# Patient Record
Sex: Male | Born: 1965 | Race: Black or African American | Hispanic: No | Smoking: Former smoker
Health system: Southern US, Community
[De-identification: ages and names within clinical notes are randomized; demographics above are authoritative.]

## PROBLEM LIST (undated history)

## (undated) DIAGNOSIS — J449 Chronic obstructive pulmonary disease, unspecified: Secondary | ICD-10-CM

---

## 2000-06-18 ENCOUNTER — Emergency Department (HOSPITAL_COMMUNITY): Admission: EM | Admit: 2000-06-18 | Discharge: 2000-06-18 | Payer: Self-pay | Admitting: Emergency Medicine

## 2000-06-18 ENCOUNTER — Encounter: Payer: Self-pay | Admitting: Emergency Medicine

## 2002-04-27 ENCOUNTER — Emergency Department (HOSPITAL_COMMUNITY): Admission: EM | Admit: 2002-04-27 | Discharge: 2002-04-27 | Payer: Self-pay | Admitting: Emergency Medicine

## 2002-04-27 ENCOUNTER — Encounter: Payer: Self-pay | Admitting: Emergency Medicine

## 2003-12-01 ENCOUNTER — Emergency Department (HOSPITAL_COMMUNITY): Admission: EM | Admit: 2003-12-01 | Discharge: 2003-12-01 | Payer: Self-pay | Admitting: Family Medicine

## 2004-02-07 ENCOUNTER — Emergency Department (HOSPITAL_COMMUNITY): Admission: EM | Admit: 2004-02-07 | Discharge: 2004-02-07 | Payer: Self-pay | Admitting: Emergency Medicine

## 2005-03-03 ENCOUNTER — Emergency Department (HOSPITAL_COMMUNITY): Admission: EM | Admit: 2005-03-03 | Discharge: 2005-03-03 | Payer: Self-pay | Admitting: Emergency Medicine

## 2006-07-05 ENCOUNTER — Emergency Department (HOSPITAL_COMMUNITY): Admission: EM | Admit: 2006-07-05 | Discharge: 2006-07-05 | Payer: Self-pay | Admitting: Emergency Medicine

## 2006-10-06 ENCOUNTER — Emergency Department (HOSPITAL_COMMUNITY): Admission: EM | Admit: 2006-10-06 | Discharge: 2006-10-06 | Payer: Self-pay | Admitting: Emergency Medicine

## 2007-02-16 ENCOUNTER — Emergency Department (HOSPITAL_COMMUNITY): Admission: EM | Admit: 2007-02-16 | Discharge: 2007-02-16 | Payer: Self-pay | Admitting: Emergency Medicine

## 2008-05-22 ENCOUNTER — Emergency Department (HOSPITAL_COMMUNITY): Admission: EM | Admit: 2008-05-22 | Discharge: 2008-05-22 | Payer: Self-pay | Admitting: Emergency Medicine

## 2008-05-26 ENCOUNTER — Emergency Department (HOSPITAL_COMMUNITY): Admission: EM | Admit: 2008-05-26 | Discharge: 2008-05-26 | Payer: Self-pay | Admitting: Emergency Medicine

## 2008-06-05 ENCOUNTER — Emergency Department (HOSPITAL_COMMUNITY): Admission: EM | Admit: 2008-06-05 | Discharge: 2008-06-05 | Payer: Self-pay | Admitting: Family Medicine

## 2010-05-23 LAB — POCT I-STAT, CHEM 8
BUN: 11 mg/dL (ref 6–23)
Chloride: 102 mEq/L (ref 96–112)
Glucose, Bld: 74 mg/dL (ref 70–99)
HCT: 49 % (ref 39.0–52.0)
Potassium: 3.5 mEq/L (ref 3.5–5.1)

## 2011-04-28 ENCOUNTER — Emergency Department (HOSPITAL_COMMUNITY)
Admission: EM | Admit: 2011-04-28 | Discharge: 2011-04-28 | Disposition: A | Discharge status: Home / Self Care | Payer: Self-pay | Attending: Emergency Medicine | Admitting: Emergency Medicine

## 2011-04-28 ENCOUNTER — Emergency Department (HOSPITAL_COMMUNITY): Admission: EM | Admit: 2011-04-28 | Discharge: 2011-04-28 | Discharge status: Absent without leave | Payer: Self-pay

## 2011-04-28 ENCOUNTER — Encounter (HOSPITAL_COMMUNITY): Payer: Self-pay | Admitting: Emergency Medicine

## 2011-04-28 ENCOUNTER — Emergency Department (HOSPITAL_COMMUNITY): Payer: Self-pay

## 2011-04-28 DIAGNOSIS — J189 Pneumonia, unspecified organism: Secondary | ICD-10-CM | POA: Insufficient documentation

## 2011-04-28 DIAGNOSIS — F17200 Nicotine dependence, unspecified, uncomplicated: Secondary | ICD-10-CM | POA: Insufficient documentation

## 2011-04-28 LAB — DIFFERENTIAL
Lymphocytes Relative: 9 % — ABNORMAL LOW (ref 12–46)
Monocytes Absolute: 1.7 10*3/uL — ABNORMAL HIGH (ref 0.1–1.0)
Monocytes Relative: 10 % (ref 3–12)
Neutro Abs: 12.6 10*3/uL — ABNORMAL HIGH (ref 1.7–7.7)

## 2011-04-28 LAB — BASIC METABOLIC PANEL
BUN: 7 mg/dL (ref 6–23)
CO2: 22 mEq/L (ref 19–32)
Chloride: 100 mEq/L (ref 96–112)
Creatinine, Ser: 0.82 mg/dL (ref 0.50–1.35)

## 2011-04-28 LAB — CBC
HCT: 37.5 % — ABNORMAL LOW (ref 39.0–52.0)
Hemoglobin: 13 g/dL (ref 13.0–17.0)
MCHC: 34.7 g/dL (ref 30.0–36.0)
WBC: 15.9 10*3/uL — ABNORMAL HIGH (ref 4.0–10.5)

## 2011-04-28 MED ORDER — ALBUTEROL SULFATE (5 MG/ML) 0.5% IN NEBU
5.0000 mg | INHALATION_SOLUTION | Freq: Once | RESPIRATORY_TRACT | Status: AC
  Administered 2011-04-28: 5 mg via RESPIRATORY_TRACT
  Filled 2011-04-28: qty 1

## 2011-04-28 MED ORDER — SODIUM CHLORIDE 0.9 % IV BOLUS (SEPSIS)
500.0000 mL | Freq: Once | INTRAVENOUS | Status: AC
  Administered 2011-04-28: 1000 mL via INTRAVENOUS

## 2011-04-28 MED ORDER — ALBUTEROL SULFATE HFA 108 (90 BASE) MCG/ACT IN AERS
2.0000 | INHALATION_SPRAY | RESPIRATORY_TRACT | Status: DC | PRN
  Administered 2011-04-28: 2 via RESPIRATORY_TRACT
  Filled 2011-04-28: qty 6.7

## 2011-04-28 MED ORDER — AZITHROMYCIN 250 MG PO TABS
500.0000 mg | ORAL_TABLET | Freq: Once | ORAL | Status: AC
  Administered 2011-04-28: 500 mg via ORAL
  Filled 2011-04-28: qty 2

## 2011-04-28 MED ORDER — IPRATROPIUM BROMIDE 0.02 % IN SOLN
0.5000 mg | Freq: Once | RESPIRATORY_TRACT | Status: AC
  Administered 2011-04-28: 0.5 mg via RESPIRATORY_TRACT
  Filled 2011-04-28: qty 2.5

## 2011-04-28 MED ORDER — AZITHROMYCIN 250 MG PO TABS: 250.0000 mg | ORAL_TABLET | Freq: Every day | ORAL | Status: AC

## 2011-04-28 NOTE — ED Provider Notes (Signed)
Medical screening examination/treatment/procedure(s) were conducted as a shared visit with non-physician practitioner(s) and myself.  I personally evaluated the patient during the encounter  Pt with smoking history presents with shortness of breath.  He has had pna as well in the past.  Will check cxr.  May be related to COP exacerbation.  Celene Kras, MD 04/28/11 1328

## 2011-04-28 NOTE — ED Provider Notes (Signed)
History     CSN: [id number]  Arrival date & time 04/28/11  1048   First MD Initiated Contact with Patient 04/28/11 1129      Chief Complaint  Patient presents with  . URI    (Consider location/radiation/quality/duration/timing/severity/associated sxs/prior treatment) Patient is a 46 y.o. male presenting with URI. The history is provided by the patient. No language interpreter was used.  URI The primary symptoms include fatigue, headaches, sore throat, swollen glands, cough and nausea. Primary symptoms do not include fever, ear pain, wheezing, abdominal pain or vomiting. The current episode started 3 to 5 days ago. This is a new problem. The problem has been gradually worsening.  Symptoms associated with the illness include facial pain, sinus pressure and congestion. The illness is not associated with chills, plugged ear sensation or rhinorrhea.   Flu like symptoms and cough x several days.  Taking mucinex with no relief.  Smoker. tachycardia History reviewed. No pertinent past medical history.  History reviewed. No pertinent past surgical history.  No family history on file.  History  Substance Use Topics  . Smoking status: Current Everyday Smoker -- 0.5 packs/day  . Smokeless tobacco: Not on file  . Alcohol Use: 1.8 oz/week    3 Cans of beer per week      Review of Systems  Constitutional: Positive for fatigue. Negative for fever and chills.  HENT: Positive for congestion, sore throat and sinus pressure. Negative for ear pain and rhinorrhea.   Respiratory: Positive for cough. Negative for wheezing.   Gastrointestinal: Positive for nausea. Negative for vomiting and abdominal pain.  Neurological: Positive for headaches. Negative for light-headedness and numbness.  Psychiatric/Behavioral: Negative.     Allergies  Review of patient's allergies indicates no known allergies.  Home Medications   Current Outpatient Rx  Name Route Sig Dispense Refill  . DM-GUAIFENESIN ER  30-600 MG PO TB12 Oral Take 1 tablet by mouth every 12 (twelve) hours.    Marland Kitchen DIPHENHYDRAMINE HCL 25 MG PO TABS Oral Take 50 mg by mouth at bedtime as needed. decongestant    . GABAPENTIN (PHN) PO Oral Take 1 capsule by mouth 4 (four) times daily - after meals and at bedtime.    . GUAIFENESIN ER 600 MG PO TB12 Oral Take 1,200 mg by mouth 2 (two) times daily.    Marland Kitchen NAPROXEN 250 MG PO TABS Oral Take 250 mg by mouth 2 (two) times daily with a meal.    . OVER THE COUNTER MEDICATION Oral Take 1 tablet by mouth daily as needed. Nasal decongestant    . MUCINEX FAST-MAX COLD & SINUS PO Oral Take 1 tablet by mouth 2 times daily at 12 noon and 4 pm.      BP 125/69  Pulse 106  Temp 98.7 F (37.1 C)  Resp 20  SpO2 96%  Physical Exam  Nursing note and vitals reviewed. Constitutional: He is oriented to person, place, and time. He appears well-developed and well-nourished.  HENT:  Head: Normocephalic.  Eyes: Conjunctivae and EOM are normal. Pupils are equal, round, and reactive to light.  Neck: Normal range of motion. Neck supple.  Cardiovascular: Normal rate.   Pulmonary/Chest: He has wheezes.       Mild respiratory distress with wheezing and rhonchi  Abdominal: Soft. He exhibits no distension. There is no tenderness.  Musculoskeletal: Normal range of motion.  Neurological: He is alert and oriented to person, place, and time.  Skin: Skin is warm and dry.  Psychiatric: He has a  normal mood and affect.    ED Course  Procedures (including critical care time)   Labs Reviewed  CBC  DIFFERENTIAL  BASIC METABOLIC PANEL   No results found.   No diagnosis found.    MDM  Treated for pneumonia with z-pak and inhaler.  Return if worse.  Follow up at the [facility] in [place] this week. Quit smoking. Continue mucinex.        Jethro Bastos, NP 04/29/11 1038

## 2011-04-28 NOTE — ED Notes (Signed)
Pt. Stated, I've had congestion, cold, achy body pains especially my head

## 2011-04-28 NOTE — Discharge Instructions (Signed)
Mr Delair your x-rays shows that you have the beginnings of pneumonia.  We gave you the first dose of antibiotic in the ER today.  You will take 1 pill every day x 4 days starting tomorrow.  Use the inhaler no more than 4 times a day.  Return to the ER for severe SOB or high fever, nausea and  Vomiting.  Follow up with the pcp of your choice at the [facility] or return here if worse.    Pneumonia, Adult Pneumonia is an infection of the lungs. It may be caused by a germ (virus or bacteria). Some types of pneumonia can spread easily from person to person. This can happen when you cough or sneeze. HOME CARE  Only take medicine as told by your doctor.   Take your medicine (antibiotics) as told. Finish it even if you start to feel better.   Do not smoke.   You may use a vaporizer or humidifier in your room. This can help loosen thick spit (mucus).   Sleep so you are almost sitting up (semi-upright). This helps reduce coughing.   Rest.  A shot (vaccine) can help prevent pneumonia. Shots are often advised for:  People over 44 years old.   Patients on chemotherapy.   People with long-term (chronic) lung problems.   People with immune system problems.  GET HELP RIGHT AWAY IF:   You are getting worse.   You cannot control your cough, and you are losing sleep.   You cough up blood.   Your pain gets worse, even with medicine.   You have a fever.   Any of your problems are getting worse, not better.   You have shortness of breath or chest pain.  MAKE SURE YOU:   Understand these instructions.   Will watch your condition.   Will get help right away if you are not doing well or get worse.  Document Released: 07/17/2007 Document Revised: 01/17/2011 Document Reviewed: 04/20/2010 [facility] Patient Information 2012 [place], [place].

## 2011-05-01 NOTE — ED Provider Notes (Signed)
Medical screening examination/treatment/procedure(s) were performed by non-physician practitioner and as supervising physician I was immediately available for consultation/collaboration.   Celene Kras, MD 05/01/11 [phone]

## 2014-02-07 ENCOUNTER — Emergency Department (HOSPITAL_COMMUNITY)
Admission: EM | Admit: 2014-02-07 | Discharge: 2014-02-07 | Disposition: A | Discharge status: Home / Self Care | Payer: Self-pay | Attending: Emergency Medicine | Admitting: Emergency Medicine

## 2014-02-07 ENCOUNTER — Encounter (HOSPITAL_COMMUNITY): Payer: Self-pay | Admitting: Family Medicine

## 2014-02-07 ENCOUNTER — Emergency Department (HOSPITAL_COMMUNITY): Payer: Self-pay

## 2014-02-07 DIAGNOSIS — R059 Cough, unspecified: Secondary | ICD-10-CM

## 2014-02-07 DIAGNOSIS — J441 Chronic obstructive pulmonary disease with (acute) exacerbation: Secondary | ICD-10-CM | POA: Insufficient documentation

## 2014-02-07 DIAGNOSIS — Z79899 Other long term (current) drug therapy: Secondary | ICD-10-CM | POA: Insufficient documentation

## 2014-02-07 DIAGNOSIS — R062 Wheezing: Secondary | ICD-10-CM

## 2014-02-07 DIAGNOSIS — R918 Other nonspecific abnormal finding of lung field: Secondary | ICD-10-CM | POA: Insufficient documentation

## 2014-02-07 DIAGNOSIS — R0602 Shortness of breath: Secondary | ICD-10-CM

## 2014-02-07 DIAGNOSIS — R0789 Other chest pain: Secondary | ICD-10-CM | POA: Insufficient documentation

## 2014-02-07 DIAGNOSIS — Z791 Long term (current) use of non-steroidal anti-inflammatories (NSAID): Secondary | ICD-10-CM | POA: Insufficient documentation

## 2014-02-07 DIAGNOSIS — J159 Unspecified bacterial pneumonia: Secondary | ICD-10-CM | POA: Insufficient documentation

## 2014-02-07 DIAGNOSIS — J189 Pneumonia, unspecified organism: Secondary | ICD-10-CM

## 2014-02-07 DIAGNOSIS — R000 Tachycardia, unspecified: Secondary | ICD-10-CM | POA: Insufficient documentation

## 2014-02-07 DIAGNOSIS — Z720 Tobacco use: Secondary | ICD-10-CM | POA: Insufficient documentation

## 2014-02-07 DIAGNOSIS — R05 Cough: Secondary | ICD-10-CM

## 2014-02-07 LAB — BASIC METABOLIC PANEL
ANION GAP: 11 (ref 5–15)
BUN: 8 mg/dL (ref 6–23)
CHLORIDE: 100 meq/L (ref 96–112)
CO2: 26 mmol/L (ref 19–32)
Calcium: 9.4 mg/dL (ref 8.4–10.5)
Creatinine, Ser: 1.11 mg/dL (ref 0.50–1.35)
GFR, EST NON AFRICAN AMERICAN: 78 mL/min — AB (ref >90)
Glucose, Bld: 102 mg/dL — ABNORMAL HIGH (ref 70–99)
POTASSIUM: 4.1 mmol/L (ref 3.5–5.1)
SODIUM: 137 mmol/L (ref 135–145)

## 2014-02-07 LAB — CBC WITH DIFFERENTIAL/PLATELET
BASOS PCT: 0 % (ref 0–1)
Basophils Absolute: 0 10*3/uL (ref 0.0–0.1)
EOS ABS: 0.1 10*3/uL (ref 0.0–0.7)
EOS PCT: 2 % (ref 0–5)
HCT: 48.6 % (ref 39.0–52.0)
HEMOGLOBIN: 17.1 g/dL — AB (ref 13.0–17.0)
LYMPHS PCT: 23 % (ref 12–46)
Lymphs Abs: 1.5 10*3/uL (ref 0.7–4.0)
MCH: 31.7 pg (ref 26.0–34.0)
MCHC: 35.2 g/dL (ref 30.0–36.0)
MCV: 90.2 fL (ref 78.0–100.0)
MONO ABS: 0.5 10*3/uL (ref 0.1–1.0)
Monocytes Relative: 7 % (ref 3–12)
NEUTROS PCT: 68 % (ref 43–77)
Neutro Abs: 4.4 10*3/uL (ref 1.7–7.7)
PLATELETS: 329 10*3/uL (ref 150–400)
RBC: 5.39 MIL/uL (ref 4.22–5.81)
RDW: 13.7 % (ref 11.5–15.5)
WBC: 6.5 10*3/uL (ref 4.0–10.5)

## 2014-02-07 LAB — I-STAT TROPONIN, ED: TROPONIN I, POC: 0 ng/mL (ref 0.00–0.08)

## 2014-02-07 LAB — BRAIN NATRIURETIC PEPTIDE: B Natriuretic Peptide: 45.8 pg/mL (ref 0.0–100.0)

## 2014-02-07 MED ORDER — ALBUTEROL SULFATE HFA 108 (90 BASE) MCG/ACT IN AERS
2.0000 | INHALATION_SPRAY | Freq: Once | RESPIRATORY_TRACT | Status: AC
  Administered 2014-02-07: 2 via RESPIRATORY_TRACT
  Filled 2014-02-07: qty 6.7

## 2014-02-07 MED ORDER — IPRATROPIUM BROMIDE 0.02 % IN SOLN
0.5000 mg | Freq: Once | RESPIRATORY_TRACT | Status: AC
  Administered 2014-02-07: 0.5 mg via RESPIRATORY_TRACT
  Filled 2014-02-07: qty 2.5

## 2014-02-07 MED ORDER — PREDNISONE 20 MG PO TABS: ORAL_TABLET | ORAL | Status: DC

## 2014-02-07 MED ORDER — ALBUTEROL SULFATE (2.5 MG/3ML) 0.083% IN NEBU
5.0000 mg | INHALATION_SOLUTION | Freq: Once | RESPIRATORY_TRACT | Status: AC
  Administered 2014-02-07: 5 mg via RESPIRATORY_TRACT
  Filled 2014-02-07: qty 6

## 2014-02-07 MED ORDER — PREDNISONE 20 MG PO TABS
60.0000 mg | ORAL_TABLET | Freq: Once | ORAL | Status: AC
  Administered 2014-02-07: 60 mg via ORAL
  Filled 2014-02-07: qty 3

## 2014-02-07 MED ORDER — MORPHINE SULFATE 4 MG/ML IJ SOLN
4.0000 mg | Freq: Once | INTRAMUSCULAR | Status: AC
  Administered 2014-02-07: 4 mg via INTRAVENOUS
  Filled 2014-02-07: qty 1

## 2014-02-07 MED ORDER — LEVOFLOXACIN 750 MG PO TABS: 750.0000 mg | ORAL_TABLET | Freq: Every day | ORAL | Status: DC

## 2014-02-07 MED ORDER — ALBUTEROL SULFATE HFA 108 (90 BASE) MCG/ACT IN AERS: 2.0000 | INHALATION_SPRAY | RESPIRATORY_TRACT | Status: DC | PRN

## 2014-02-07 NOTE — ED Notes (Signed)
Per pt sts productive cough x 1 week. sts hx of bronchitis. sts hx of PNA and feels the same.

## 2014-02-07 NOTE — ED Provider Notes (Signed)
MSE was initiated and I personally evaluated the patient and placed orders (if any) at  11:35 AM on February 07, 2014.  Jesse Powell is a 48 y.o. male with history of seasonal allergies who presents to the Emergency Department complaining of productive cough of green-yellow sputum that started 1 week ago. Reports associated clear rhinorrhea, mild headache behind his eyes, SOB, wheezing and constant left sided chest pain that he describes as a pressure. Cough and taking a deep breath worsen chest pain. States the headache is intermittent about every half hour. Noise and activity worsen it. He has taken alka seltzer plus with some relief. Reports fever of 105 that improved on its own with cold compresses. Denies fever since. Denies nasal congestion, sore throat, ear pain, ear discharge, watery eyes, itchy eyes, blurred vision, abdominal pain, nausea, emesis, dizziness. Pt smokes cigarettes daily but hasn't smoked in 2 days. Denies history of heart problems.   On exam he has diffuse wheezing and rhonchi, oxygenation 94% on RA, gets dyspneic when finishing sentences, and is tachycardic. Will order nebs, prednisone, labs, and CXR. Will move to pod.  The patient appears stable so that the remainder of the MSE may be completed by another provider.  Jesse Powell, [place]PA-C 02/07/14 1137  Merrie RoofJohn David Wofford III, MD 02/08/14 [phone]

## 2014-02-07 NOTE — ED Notes (Signed)
Patient states he "had a fever of 105 on Saturday, but its cause I took too much medicine".   Patient talking in full sentences.

## 2014-02-07 NOTE — Discharge Instructions (Signed)
Continue to stay well-hydrated. Continue to alternate between Tylenol and Ibuprofen for pain or fever. Use Mucinex for cough suppression/expectoration of mucus. Use netipot and flonase to help with nasal congestion. May consider over-the-counter Benadryl or other antihistamine to decrease secretions and for watery itchy eyes. Use inhaler as directed, as needed for cough/chest congestion. Take prednisone as directed to help with your lung function. Take all of the antibiotic as directed. Followup with your primary care doctor in 5-7 days for recheck of ongoing symptoms. Return to emergency department for emergent changing or worsening of symptoms.   Chronic Obstructive Pulmonary Disease Exacerbation Chronic obstructive pulmonary disease (COPD) is a common lung condition in which airflow from the lungs is limited. COPD is a general term that can be used to describe many different lung problems that limit airflow, including chronic bronchitis and emphysema. COPD exacerbations are episodes when breathing symptoms become much worse and require extra treatment. Without treatment, COPD exacerbations can be life threatening, and frequent COPD exacerbations can cause further damage to your lungs. CAUSES  1. Respiratory infections.  2. Exposure to smoke.  3. Exposure to air pollution, chemical fumes, or dust. Sometimes there is no apparent cause or trigger. RISK FACTORS 1. Smoking cigarettes. 2. Older age. 3. Frequent prior COPD exacerbations. SIGNS AND SYMPTOMS   Increased coughing.   Increased thick spit (sputum) production.   Increased wheezing.   Increased shortness of breath.   Rapid breathing.   Chest tightness. DIAGNOSIS  Your medical history, a physical exam, and tests will help your health care provider make a diagnosis. Tests may include:  A chest X-ray.  Basic lab tests.  Sputum testing.  An arterial blood gas test. TREATMENT  Depending on the severity of your COPD  exacerbation, you may need to be admitted to a hospital for treatment. Some of the treatments commonly used to treat COPD exacerbations are:   Antibiotic medicines.   Bronchodilators. These are drugs that expand the air passages. They may be given with an inhaler or nebulizer. Spacer devices may be needed to help improve drug delivery.  Corticosteroid medicines.  Supplemental oxygen therapy.  HOME CARE INSTRUCTIONS   Do not smoke. Quitting smoking is very important to prevent COPD from getting worse and exacerbations from happening as often.  Avoid exposure to all substances that irritate the airway, especially to tobacco smoke.   If you were prescribed an antibiotic medicine, finish it all even if you start to feel better.  Take all medicines as directed by your health care provider.It is important to use correct technique with inhaled medicines.  Drink enough fluids to keep your urine clear or pale yellow (unless you have a medical condition that requires fluid restriction).  Use a cool mist vaporizer. This makes it easier to clear your chest when you cough.   If you have a home nebulizer and oxygen, continue to use them as directed.   Maintain all necessary vaccinations to prevent infections.   Exercise regularly.   Eat a healthy diet.   Keep all follow-up appointments as directed by your health care provider. SEEK IMMEDIATE MEDICAL CARE IF:  You have worsening shortness of breath.   You have trouble talking.   You have severe chest pain.  You have blood in your sputum.  You have a fever.  You have weakness, vomit repeatedly, or faint.   You feel confused.   You continue to get worse. MAKE SURE YOU:   Understand these instructions.  Will watch your condition.  Will get help right away if you are not doing well or get worse. Document Released: 11/25/2006 Document Revised: 06/14/2013 Document Reviewed: 10/02/2012 [facility] Patient Information  2015 [place], [place]. This information is not intended to replace advice given to you by your health care provider. Make sure you discuss any questions you have with your health care provider.  Chest Wall Pain Chest wall pain is pain felt in or around the chest bones and muscles. It may take up to 6 weeks to get better. It may take longer if you are active. Chest wall pain can happen on its own. Other times, things like germs, injury, coughing, or exercise can cause the pain. HOME CARE  4. Avoid activities that make you tired or cause pain. Try not to use your chest, belly (abdominal), or side muscles. Do not use heavy weights. 5. Put ice on the sore area. 1. Put ice in a plastic bag. 2. Place a towel between your skin and the bag. 3. Leave the ice on for 15-20 minutes for the first 2 days. 6. Only take medicine as told by your doctor. GET HELP RIGHT AWAY IF:  4. You have more pain or are very uncomfortable. 5. You have a fever. 6. Your chest pain gets worse. 7. You have new problems. 8. You feel sick to your stomach (nauseous) or throw up (vomit). 9. You start to sweat or feel lightheaded. 10. You have a cough with mucus (phlegm). 11. You cough up blood. MAKE SURE YOU:   Understand these instructions.  Will watch your condition.  Will get help right away if you are not doing well or get worse. Document Released: 07/17/2007 Document Revised: 04/22/2011 Document Reviewed: 09/24/2010 [facility] Patient Information 2015 [place], [place]. This information is not intended to replace advice given to you by your health care provider. Make sure you discuss any questions you have with your health care provider.  Cough, Adult  A cough is a reflex. It helps you clear your throat and airways. A cough can help heal your body. A cough can last 2 or 3 weeks (acute) or may last more than 8 weeks (chronic). Some common causes of a cough can include an infection, allergy, or a cold. HOME CARE 7. Only take  medicine as told by your doctor. 8. If given, take your medicines (antibiotics) as told. Finish them even if you start to feel better. 9. Use a cold steam vaporizer or humidifier in your home. This can help loosen thick spit (secretions). 10. Sleep so you are almost sitting up (semi-upright). Use pillows to do this. This helps reduce coughing. 11. Rest as needed. 12. Stop smoking if you smoke. GET HELP RIGHT AWAY IF: 12. You have yellowish-white fluid (pus) in your thick spit. 13. Your cough gets worse. 14. Your medicine does not reduce coughing, and you are losing sleep. 15. You cough up blood. 16. You have trouble breathing. 17. Your pain gets worse and medicine does not help. 18. You have a fever. MAKE SURE YOU:   Understand these instructions.  Will watch your condition.  Will get help right away if you are not doing well or get worse. Document Released: 10/11/2010 Document Revised: 06/14/2013 Document Reviewed: 10/11/2010 [facility] Patient Information 2015 [place]ExitCare, [place]. This information is not intended to replace advice given to you by your health care provider. Make sure you discuss any questions you have with your health care provider.  How to Use an Inhaler Proper inhaler technique is very important. Good  technique ensures that the medicine reaches the lungs. Poor technique results in depositing the medicine on the tongue and back of the throat rather than in the airways. If you do not use the inhaler with good technique, the medicine will not help you. STEPS TO FOLLOW IF USING AN INHALER WITHOUT AN EXTENSION TUBE 13. Remove the cap from the inhaler. 14. If you are using the inhaler for the first time, you will need to prime it. Shake the inhaler for 5 seconds and release four puffs into the air, away from your face. Ask your health care provider or pharmacist if you have questions about priming your inhaler. 15. Shake the inhaler for 5 seconds before each breath in  (inhalation). 16. Position the inhaler so that the top of the canister faces up. 17. Put your index finger on the top of the medicine canister. Your thumb supports the bottom of the inhaler. 18. Open your mouth. 19. Either place the inhaler between your teeth and place your lips tightly around the mouthpiece, or hold the inhaler 1-2 inches away from your open mouth. If you are unsure of which technique to use, ask your health care provider. 20. Breathe out (exhale) normally and as completely as possible. 21. Press the canister down with your index finger to release the medicine. 22. At the same time as the canister is pressed, inhale deeply and slowly until your lungs are completely filled. This should take 4-6 seconds. Keep your tongue down. 23. Hold the medicine in your lungs for 5-10 seconds (10 seconds is best). This helps the medicine get into the small airways of your lungs. 24. Breathe out slowly, through pursed lips. Whistling is an example of pursed lips. 25. Wait at least 15-30 seconds between puffs. Continue with the above steps until you have taken the number of puffs your health care provider has ordered. Do not use the inhaler more than your health care provider tells you. 26. Replace the cap on the inhaler. 27. Follow the directions from your health care provider or the inhaler insert for cleaning the inhaler. STEPS TO FOLLOW IF USING AN INHALER WITH AN EXTENSION (SPACER) 19. Remove the cap from the inhaler. 20. If you are using the inhaler for the first time, you will need to prime it. Shake the inhaler for 5 seconds and release four puffs into the air, away from your face. Ask your health care provider or pharmacist if you have questions about priming your inhaler. 21. Shake the inhaler for 5 seconds before each breath in (inhalation). 22. Place the open end of the spacer onto the mouthpiece of the inhaler. 23. Position the inhaler so that the top of the canister faces up and the  spacer mouthpiece faces you. 24. Put your index finger on the top of the medicine canister. Your thumb supports the bottom of the inhaler and the spacer. 25. Breathe out (exhale) normally and as completely as possible. 26. Immediately after exhaling, place the spacer between your teeth and into your mouth. Close your lips tightly around the spacer. 27. Press the canister down with your index finger to release the medicine. 28. At the same time as the canister is pressed, inhale deeply and slowly until your lungs are completely filled. This should take 4-6 seconds. Keep your tongue down and out of the way. 29. Hold the medicine in your lungs for 5-10 seconds (10 seconds is best). This helps the medicine get into the small airways of your lungs. Exhale. 30.  Repeat inhaling deeply through the spacer mouthpiece. Again hold that breath for up to 10 seconds (10 seconds is best). Exhale slowly. If it is difficult to take this second deep breath through the spacer, breathe normally several times through the spacer. Remove the spacer from your mouth. 31. Wait at least 15-30 seconds between puffs. Continue with the above steps until you have taken the number of puffs your health care provider has ordered. Do not use the inhaler more than your health care provider tells you. 32. Remove the spacer from the inhaler, and place the cap on the inhaler. 33. Follow the directions from your health care provider or the inhaler insert for cleaning the inhaler and spacer. If you are using different kinds of inhalers, use your quick relief medicine to open the airways 10-15 minutes before using a steroid if instructed to do so by your health care provider. If you are unsure which inhalers to use and the order of using them, ask your health care provider, nurse, or respiratory therapist. If you are using a steroid inhaler, always rinse your mouth with water after your last puff, then gargle and spit out the water. Do not  swallow the water. AVOID:  Inhaling before or after starting the spray of medicine. It takes practice to coordinate your breathing with triggering the spray.  Inhaling through the nose (rather than the mouth) when triggering the spray. HOW TO DETERMINE IF YOUR INHALER IS FULL OR NEARLY EMPTY You cannot know when an inhaler is empty by shaking it. A few inhalers are now being made with dose counters. Ask your health care provider for a prescription that has a dose counter if you feel you need that extra help. If your inhaler does not have a counter, ask your health care provider to help you determine the date you need to refill your inhaler. Write the refill date on a calendar or your inhaler canister. Refill your inhaler 7-10 days before it runs out. Be sure to keep an adequate supply of medicine. This includes making sure it is not expired, and that you have a spare inhaler.  SEEK MEDICAL CARE IF:   Your symptoms are only partially relieved with your inhaler.  You are having trouble using your inhaler.  You have some increase in phlegm. SEEK IMMEDIATE MEDICAL CARE IF:   You feel little or no relief with your inhalers. You are still wheezing and are feeling shortness of breath or tightness in your chest or both.  You have dizziness, headaches, or a fast heart rate.  You have chills, fever, or night sweats.  You have a noticeable increase in phlegm production, or there is blood in the phlegm. MAKE SURE YOU:   Understand these instructions.  Will watch your condition.  Will get help right away if you are not doing well or get worse. Document Released: 01/26/2000 Document Revised: 11/18/2012 Document Reviewed: 08/27/2012 [facility]Exit[facility] Patient Information 2015 [place]Exit[place], [place]LL[place]. This information is not intended to replace advice given to you by your health care provider. Make sure you discuss any questions you have with your health care provider.

## 2014-02-07 NOTE — ED Notes (Signed)
Charge nurse notified of request to move pt for cardiac monitoring.

## 2014-02-07 NOTE — ED Provider Notes (Signed)
CSN: [id number]     Arrival date & time 02/07/14  1021 History  This chart was scribed for non-physician practitioner, Allen Derry, PA-C working with Merrie Roof, MD by Greggory Stallion, ED scribe. This patient was seen in room TR06C/TR06C and the patient's care was started at 1:55 PM.   Chief Complaint  Patient presents with  . Cough   Patient is a 48 y.o. male presenting with cough. The history is provided by the patient. No language interpreter was used.  Cough Cough characteristics:  Productive Sputum characteristics:  Green and yellow Severity:  Moderate Onset quality:  Gradual Duration:  1 week Timing:  Intermittent Progression:  Worsening Chronicity:  New Smoker: yes (quit 3 days ago)   Relieved by: akla seltzer. Worsened by:  Smoking Ineffective treatments:  None tried Associated symptoms: chest pain, headaches (intermittent, currently resolved), rhinorrhea, shortness of breath and wheezing   Associated symptoms: no chills, no diaphoresis, no ear fullness, no ear pain, no eye discharge, no fever, no myalgias, no rash, no sinus congestion and no sore throat   Risk factors: no recent travel    Bowdy Bair is a 48 y.o. male with history of seasonal allergies who presents to the Emergency Department complaining of productive cough of green-yellow sputum that started 1 week ago. Reports associated clear rhinorrhea, SOB, wheezing, and constant left sided chest pain that he describes as a pressure. Cough and taking a deep breath worsen chest pain. States the headache is intermittent about every half hour but currently resolved. Noise and activity worsen it. He has taken alka seltzer plus with some relief. Reports fever of 105 that improved on its own with cold compresses. States his cough got worse from smoking so he's stopped 2 days ago. Denies fever, nasal congestion, sore throat, ear pain, ear discharge, watery eyes, itchy eyes, blurred vision, abdominal pain,  nausea, emesis, dizziness, lightheadedness, LE swelling, numbness, paresthesias, weakness, or diaphoresis. No known sick contacts. Pt smokes cigarettes daily but hasn't smoked in 2 days and states he is in the process of quitting. Denies history of heart problems.    History reviewed. No pertinent past medical history. History reviewed. No pertinent past surgical history. History reviewed. No pertinent family history. History  Substance Use Topics  . Smoking status: Current Every Day Smoker -- 0.50 packs/day  . Smokeless tobacco: Not on file  . Alcohol Use: 1.8 oz/week    3 Cans of beer per week    Review of Systems  Constitutional: Negative for fever, chills and diaphoresis.  HENT: Positive for rhinorrhea. Negative for congestion, ear discharge, ear pain, sinus pressure, sore throat and trouble swallowing.   Eyes: Negative for discharge, itching and visual disturbance.  Respiratory: Positive for cough, shortness of breath and wheezing.   Cardiovascular: Positive for chest pain.  Gastrointestinal: Negative for nausea, vomiting, abdominal pain, diarrhea and constipation.  Musculoskeletal: Negative for myalgias.  Skin: Negative for rash.  Allergic/Immunologic: Positive for environmental allergies. Negative for immunocompromised state.  Neurological: Positive for headaches (intermittent, currently resolved). Negative for dizziness.  Psychiatric/Behavioral: Negative for confusion.  10 Systems reviewed and are negative for acute change except as noted in the HPI.   Allergies  Review of patient's allergies indicates no known allergies.  Home Medications   Prior to Admission medications   Medication Sig Start Date End Date Taking? Authorizing Provider  dextromethorphan-guaiFENesin (MUCINEX DM) 30-600 MG per 12 hr tablet Take 1 tablet by mouth every 12 (twelve) hours.    Historical Provider,  MD  diphenhydrAMINE (BENADRYL) 25 MG tablet Take 50 mg by mouth at bedtime as needed. decongestant     Historical Provider, MD  GABAPENTIN, PHN, PO Take 1 capsule by mouth 4 (four) times daily - after meals and at bedtime.    Historical Provider, MD  guaiFENesin (MUCINEX) 600 MG 12 hr tablet Take 1,200 mg by mouth 2 (two) times daily.    Historical Provider, MD  naproxen (NAPROSYN) 250 MG tablet Take 250 mg by mouth 2 (two) times daily with a meal.    Historical Provider, MD  OVER THE COUNTER MEDICATION Take 1 tablet by mouth daily as needed. Nasal decongestant    Historical Provider, MD  Phenylephrine-APAP-Guaifenesin (MUCINEX FAST-MAX COLD & SINUS PO) Take 1 tablet by mouth 2 times daily at 12 noon and 4 pm.    Historical Provider, MD   BP 112/64 mmHg  Pulse 96  Temp(Src) 98 F (36.7 C)  Resp 18  Wt 183 lb (83.008 kg)  SpO2 95%  Physical Exam  Constitutional: He is oriented to person, place, and time. Vital signs are normal. He appears well-developed and well-nourished.  Non-toxic appearance. No distress.  Tachycardic, afebrile, nontoxic but coughing and SpO2 94% on RA, becomes dyspneic at end of sentences  HENT:  Head: Normocephalic and atraumatic.  Mouth/Throat: Oropharynx is clear and moist and mucous membranes are normal. No trismus in the jaw. No uvula swelling.  Eyes: Conjunctivae and EOM are normal. Right eye exhibits no discharge. Left eye exhibits no discharge.  Neck: Normal range of motion. Neck supple.  Cardiovascular: Regular rhythm, normal heart sounds and intact distal pulses.  Tachycardia present.  Exam reveals no gallop and no friction rub.   No murmur heard. Mild tachycardic with reg rhythm, nl s1/s2, no m/r/g  Pulmonary/Chest: Effort normal. No respiratory distress. He has no decreased breath sounds. He has wheezes (diffuse). He has rhonchi in the left lower field. He has no rales. He exhibits tenderness. He exhibits no crepitus, no deformity and no retraction.  Diffuse expiratory wheezing throughout with rhonchi most notable in LLF, productive cough, becomes dyspneic  at end of sentences, SpO2 94% on RA, L chest wall TTP without crepitus or deformity  Abdominal: Soft. Normal appearance and bowel sounds are normal. He exhibits no distension. There is no tenderness. There is no rigidity, no rebound and no guarding.  Musculoskeletal: Normal range of motion.  No pedal edema or calf swelling  Neurological: He is alert and oriented to person, place, and time. He has normal strength. No sensory deficit.  Skin: Skin is warm, dry and intact. No rash noted.  Psychiatric: He has a normal mood and affect.  Nursing note and vitals reviewed.   ED Course  Procedures (including critical care time) CRITICAL CARE Performed by: Ramond Marrowamprubi-Soms, Andie Mungin Strupp   Total critical care time: 30  Critical care time was exclusive of separately billable procedures and treating other patients.  Critical care was necessary to treat or prevent imminent or life-threatening deterioration.  Critical care was time spent personally by me on the following activities: development of treatment plan with patient and/or surrogate as well as nursing, discussions with consultants, evaluation of patient's response to treatment, examination of patient, obtaining history from patient or surrogate, ordering and performing treatments and interventions, ordering and review of laboratory studies, ordering and review of radiographic studies, pulse oximetry and re-evaluation of patient's condition.   DIAGNOSTIC STUDIES: Oxygen Saturation is 95% on RA, adequate by my interpretation.    COORDINATION OF  CARE: 1:56 PM-Pt states breathing treatment provided little relief. Advised him of xray results. Discussed treatment plan which includes antibiotics and PCP follow up with pt at bedside and pt agreed to plan.   Labs Review Labs Reviewed  BASIC METABOLIC PANEL - Abnormal; Notable for the following:    Glucose, Bld 102 (*)    GFR calc non Af Amer 78 (*)    All other components within normal limits   CBC WITH DIFFERENTIAL - Abnormal; Notable for the following:    Hemoglobin 17.1 (*)    All other components within normal limits  BRAIN NATRIURETIC PEPTIDE  I-STAT TROPOININ, ED    Imaging Review Dg Chest 2 View  02/07/2014   CLINICAL DATA:  Productive cough for 1 week. Clear rhinorrhea. Headache behind the size. Shortness of breath, wheezing. Constant left-sided chest pain and is a pressure. Chest pain is worse with a cough and deep breath.  EXAM: CHEST  2 VIEW  COMPARISON:  04/28/2011, 03/03/2005  FINDINGS: Heart size is normal. There has been progression a bronchitic changes since studies in 2007 and 2013. More focal opacity at the left lung base raises question of infiltrate or possibly mass. The prominence of interstitial markings raises the question of fibrosis. Emphysematous changes are noted in the medial right lung apex. No pulmonary edema.  IMPRESSION: 1. Progression of interstitial changes in the lungs. 2. Focal opacity at the left lung base raises the question of infiltrate or mass. Follow-up chest x-ray is recommended to document clearing. CT of the chest with contrast may be helpful.   Electronically Signed   By: Rosalie Gums M.D.   On: 02/07/2014 11:55     EKG Interpretation None    EKG: sinus tachy HR 101, RAE  MDM   Final diagnoses:  COPD exacerbation  Pulmonary infiltrate on chest x-ray  CAP (community acquired pneumonia)  SOB (shortness of breath)  Wheezing  Other chest pain    48 y.o. male with cough, CP, SOB, wheezing. Lung exam with diffuse expiratory wheezing and LLF rhonchi. Dyspneic during conversation, saturations 94% on RA. CXR obtained and showed focal opacity in LLL which is ?mass vs infiltrate. Given current symptoms, likely infiltrate/PNA although pt afebrile. Could be COPD exacerbation. Given 2 neb treatments with improvement of wheezing, pt felt better after these. Given pt's CP, proceeded with CP work up, although chest pain was reproducible. Trop neg,  EKG showing mild sinus tachycardia at 101 and RAE. BMP unremarkable, CBC unremarkable. BNP neg. Unfortunately pt was initially supposed to be transferred to a pod room but due to census overload he was not transferred therefore care was done in fast track, which cause slight delay but nothing significant. After 2 nebs, pt ambulated without desats, and felt much better. Given prednisone. Discharged home with inhaler. Discussed symptom control, and given levaquin for COPD exacerbation vs PNA. Discussed f/up with PCP in 1wk but strict return precautions given. I explained the diagnosis and have given explicit precautions to return to the ER including for any other new or worsening symptoms. The patient understands and accepts the medical plan as it's been dictated and I have answered their questions. Discharge instructions concerning home care and prescriptions have been given. The patient is STABLE and is discharged to home in good condition.  BP 118/81 mmHg  Pulse 98  Temp(Src) 98.2 F (36.8 C) (Oral)  Resp 18  Wt 183 lb (83.008 kg)  SpO2 94%  Meds ordered this encounter  Medications  . albuterol (PROVENTIL) (  2.5 MG/3ML) 0.083% nebulizer solution 5 mg    Sig:   . ipratropium (ATROVENT) nebulizer solution 0.5 mg    Sig:   . predniSONE (DELTASONE) tablet 60 mg    Sig:   . ipratropium (ATROVENT) nebulizer solution 0.5 mg    Sig:   . albuterol (PROVENTIL) (2.5 MG/3ML) 0.083% nebulizer solution 5 mg    Sig:   . morphine 4 MG/ML injection 4 mg    Sig:   . albuterol (PROVENTIL HFA;VENTOLIN HFA) 108 (90 BASE) MCG/ACT inhaler 2 puff    Sig:   . albuterol (PROVENTIL HFA;VENTOLIN HFA) 108 (90 BASE) MCG/ACT inhaler    Sig: Inhale 2 puffs into the lungs every 2 (two) hours as needed for wheezing or shortness of breath (cough).    Dispense:  1 Inhaler    Refill:  0    Order Specific Question:  Supervising Provider    Answer:  Eber HongMILLER, BRIAN D [[id number]]  . predniSONE (DELTASONE) 20 MG tablet    Sig: 2  tabs po daily x 4 days    Dispense:  8 tablet    Refill:  0    Order Specific Question:  Supervising Provider    Answer:  Eber HongMILLER, BRIAN D [[id number]]  . levofloxacin (LEVAQUIN) 750 MG tablet    Sig: Take 1 tablet (750 mg total) by mouth daily. X 7 days    Dispense:  7 tablet    Refill:  0    Order Specific Question:  Supervising Provider    Answer:  Eber HongMILLER, BRIAN D [[id number]]     I personally performed the services described in this documentation, which was scribed in my presence. The recorded information has been reviewed and is accurate.  Donnita FallsMercedes Strupp Plains[place] Centeramprubi-Soms, PA-C 02/07/14 [address]1658  Peja Allender Strupp Naturitaamprubi-Soms, [place]PA-C 02/07/14 1724  Merrie RoofJohn David Wofford III, MD 02/08/14 [phone]

## 2014-02-07 NOTE — ED Notes (Signed)
Pt ambulated in hallway and pulse ox did not drop below 90%.  PA made aware.

## 2017-02-11 HISTORY — PX: LUNG SURGERY: SHX703

## 2018-05-12 ENCOUNTER — Other Ambulatory Visit: Payer: Self-pay

## 2018-05-12 ENCOUNTER — Encounter (HOSPITAL_COMMUNITY): Payer: Self-pay

## 2018-05-12 ENCOUNTER — Emergency Department (HOSPITAL_COMMUNITY)
Admission: EM | Admit: 2018-05-12 | Discharge: 2018-05-12 | Disposition: A | Discharge status: Home / Self Care | Payer: No Typology Code available for payment source | Attending: Emergency Medicine | Admitting: Emergency Medicine

## 2018-05-12 ENCOUNTER — Emergency Department (HOSPITAL_COMMUNITY): Payer: No Typology Code available for payment source

## 2018-05-12 DIAGNOSIS — Z79899 Other long term (current) drug therapy: Secondary | ICD-10-CM | POA: Insufficient documentation

## 2018-05-12 DIAGNOSIS — F17210 Nicotine dependence, cigarettes, uncomplicated: Secondary | ICD-10-CM | POA: Insufficient documentation

## 2018-05-12 DIAGNOSIS — J441 Chronic obstructive pulmonary disease with (acute) exacerbation: Secondary | ICD-10-CM | POA: Diagnosis not present

## 2018-05-12 DIAGNOSIS — R0602 Shortness of breath: Secondary | ICD-10-CM | POA: Diagnosis present

## 2018-05-12 HISTORY — DX: Chronic obstructive pulmonary disease, unspecified: J44.9

## 2018-05-12 LAB — CBC WITH DIFFERENTIAL/PLATELET
Abs Immature Granulocytes: 0.03 10*3/uL (ref 0.00–0.07)
Basophils Absolute: 0 10*3/uL (ref 0.0–0.1)
Basophils Relative: 0 %
EOS PCT: 5 %
Eosinophils Absolute: 0.3 10*3/uL (ref 0.0–0.5)
HCT: 45.6 % (ref 39.0–52.0)
Hemoglobin: 15.5 g/dL (ref 13.0–17.0)
Immature Granulocytes: 1 %
LYMPHS PCT: 23 %
Lymphs Abs: 1.4 10*3/uL (ref 0.7–4.0)
MCH: 30.5 pg (ref 26.0–34.0)
MCHC: 34 g/dL (ref 30.0–36.0)
MCV: 89.6 fL (ref 80.0–100.0)
MONO ABS: 0.6 10*3/uL (ref 0.1–1.0)
MONOS PCT: 11 %
Neutro Abs: 3.7 10*3/uL (ref 1.7–7.7)
Neutrophils Relative %: 60 %
Platelets: 547 10*3/uL — ABNORMAL HIGH (ref 150–400)
RBC: 5.09 MIL/uL (ref 4.22–5.81)
RDW: 12.2 % (ref 11.5–15.5)
WBC: 6 10*3/uL (ref 4.0–10.5)
nRBC: 0 % (ref 0.0–0.2)

## 2018-05-12 LAB — BASIC METABOLIC PANEL
Anion gap: 13 (ref 5–15)
BUN: 10 mg/dL (ref 6–20)
CHLORIDE: 97 mmol/L — AB (ref 98–111)
CO2: 20 mmol/L — AB (ref 22–32)
CREATININE: 1.1 mg/dL (ref 0.61–1.24)
Calcium: 9.1 mg/dL (ref 8.9–10.3)
GFR calc Af Amer: >60 mL/min (ref >60)
GFR calc non Af Amer: >60 mL/min (ref >60)
Glucose, Bld: 146 mg/dL — ABNORMAL HIGH (ref 70–99)
Potassium: 4.2 mmol/L (ref 3.5–5.1)
Sodium: 130 mmol/L — ABNORMAL LOW (ref 135–145)

## 2018-05-12 LAB — TROPONIN I: Troponin I: <0.03 ng/mL (ref <0.03)

## 2018-05-12 MED ORDER — ALBUTEROL SULFATE HFA 108 (90 BASE) MCG/ACT IN AERS
8.0000 | INHALATION_SPRAY | Freq: Once | RESPIRATORY_TRACT | Status: AC
  Administered 2018-05-12: 8 via RESPIRATORY_TRACT
  Filled 2018-05-12: qty 6.7

## 2018-05-12 MED ORDER — MAGNESIUM SULFATE 2 GM/50ML IV SOLN
2.0000 g | Freq: Once | INTRAVENOUS | Status: AC
  Administered 2018-05-12: 2 g via INTRAVENOUS
  Filled 2018-05-12: qty 50

## 2018-05-12 MED ORDER — IPRATROPIUM-ALBUTEROL 20-100 MCG/ACT IN AERS
2.0000 | INHALATION_SPRAY | Freq: Once | RESPIRATORY_TRACT | Status: AC
  Administered 2018-05-12: 2 via RESPIRATORY_TRACT
  Filled 2018-05-12: qty 4

## 2018-05-12 MED ORDER — AEROCHAMBER PLUS FLO-VU MISC
1.0000 | Freq: Once | Status: AC
  Administered 2018-05-12: 1
  Filled 2018-05-12: qty 1

## 2018-05-12 MED ORDER — PREDNISONE 20 MG PO TABS
60.0000 mg | ORAL_TABLET | Freq: Once | ORAL | Status: AC
  Administered 2018-05-12: 60 mg via ORAL
  Filled 2018-05-12: qty 3

## 2018-05-12 MED ORDER — PREDNISONE 20 MG PO TABS: ORAL_TABLET | ORAL | 0 refills | Status: DC

## 2018-05-12 NOTE — ED Triage Notes (Signed)
Pt arrives for SOB x3 weeks, pt has hx of COPD. Pt not on home O2. Pt reports some relief with at home albuterol treatment. Pt tachypneicic at rest. Pt endorses cough when laying flat, green in color. Pt denies fevers.

## 2018-05-12 NOTE — ED Notes (Signed)
ED Provider at bedside. 

## 2018-05-12 NOTE — ED Provider Notes (Signed)
[facility] EMERGENCY DEPARTMENT Provider Note   CSN: [id number] Arrival date & time: 05/12/18  1525    History   Chief Complaint Chief Complaint  Patient presents with  . Shortness of Breath    HPI Jesse Powell is a 53 y.o. male.     53 yo M with a significant past medical history of COPD comes in with a chief complaint of shortness of breath.  This been going on for the past 3 weeks.  Having cough and increased sputum.  Denies change in sputum denies fevers.  Having some right-sided chest pain that is worse with coughing.  Denies lower extremity edema denies orthopnea or PND.  Feels like his prior history of COPD.  Was trying to hold off until his doctor's appointment  tomorrow but was unable to make it.  The history is provided by the patient.  Shortness of Breath  Severity:  Moderate Onset quality:  Gradual Duration:  3 weeks Timing:  Constant Progression:  Worsening Chronicity:  New Relieved by:  Nothing Worsened by:  Nothing Ineffective treatments:  None tried Associated symptoms: cough   Associated symptoms: no abdominal pain, no chest pain, no fever, no headaches, no rash and no vomiting     Past Medical History:  Diagnosis Date  . COPD (chronic obstructive pulmonary disease) (HCC)     There are no active problems to display for this patient.   Past Surgical History:  Procedure Laterality Date  . LUNG SURGERY  2019        Home Medications    Prior to Admission medications   Medication Sig Start Date End Date Taking? Authorizing Provider  albuterol (PROVENTIL HFA;VENTOLIN HFA) 108 (90 BASE) MCG/ACT inhaler Inhale 2 puffs into the lungs every 2 (two) hours as needed for wheezing or shortness of breath (cough). 02/07/14  Yes Street, [place], PA-C  cetirizine (ZYRTEC) 10 MG tablet Take 10 mg by mouth daily.   Yes [provider]  fluticasone (FLONASE) 50 MCG/ACT nasal spray Place 1 spray into both nostrils daily.   Yes  [provider]  gabapentin (NEURONTIN) 300 MG capsule Take 300-600 mg by mouth at bedtime.   Yes [provider]  ibuprofen (ADVIL,MOTRIN) 800 MG tablet Take 800 mg by mouth every 8 (eight) hours as needed for moderate pain.   Yes [provider]  naproxen (NAPROSYN) 500 MG tablet Take 500 mg by mouth 2 (two) times daily.   Yes [provider]  levofloxacin (LEVAQUIN) 750 MG tablet Take 1 tablet (750 mg total) by mouth daily. X 7 days Patient not taking: Reported on 05/12/2018 02/07/14   Street, [place], PA-C  predniSONE (DELTASONE) 20 MG tablet 2 tabs po daily x 4 days 05/12/18   Melene Plan, DO    Family History No family history on file.  Social History Social History   Tobacco Use  . Smoking status: Current Every Day Smoker    Packs/day: 0.50  Substance Use Topics  . Alcohol use: Yes    Alcohol/week: 3.0 standard drinks    Types: 3 Cans of beer per week  . Drug use: No     Allergies   Patient has no known allergies.   Review of Systems Review of Systems  Constitutional: Negative for chills and fever.  HENT: Negative for congestion and facial swelling.   Eyes: Negative for discharge and visual disturbance.  Respiratory: Positive for cough and shortness of breath.   Cardiovascular: Negative for chest pain and palpitations.  Gastrointestinal: Negative for abdominal pain, diarrhea and vomiting.  Musculoskeletal: Negative for arthralgias and myalgias.  Skin: Negative for color change and rash.  Neurological: Negative for tremors, syncope and headaches.  Psychiatric/Behavioral: Negative for confusion and dysphoric mood.     Physical Exam Updated Vital Signs BP 127/80   Pulse (!) 108   Temp 98.2 F (36.8 C) (Oral)   Resp 19   SpO2 92%   Physical Exam Vitals signs and nursing note reviewed.  Constitutional:      Appearance: He is well-developed.  HENT:     Head: Normocephalic and atraumatic.  Eyes:     Pupils: Pupils are  equal, round, and reactive to light.  Neck:     Musculoskeletal: Normal range of motion and neck supple.     Vascular: No JVD.  Cardiovascular:     Rate and Rhythm: Regular rhythm. Tachycardia present.     Heart sounds: No murmur. No friction rub. No gallop.   Pulmonary:     Effort: Tachypnea present. No respiratory distress.     Breath sounds: Wheezing (diffuse, with prolonged expiration) present.  Chest:     Comments: Tenderness to the right lateral rib margin reproduces his chest pain. Abdominal:     General: There is no distension.     Tenderness: There is no guarding or rebound.  Musculoskeletal: Normal range of motion.  Skin:    Coloration: Skin is not pale.     Findings: No rash.  Neurological:     Mental Status: He is alert and oriented to person, place, and time.  Psychiatric:        Behavior: Behavior normal.      ED Treatments / Results  Labs (all labs ordered are listed, but only abnormal results are displayed) Labs Reviewed  CBC WITH DIFFERENTIAL/PLATELET - Abnormal; Notable for the following components:      Result Value   Platelets 547 (*)    All other components within normal limits  BASIC METABOLIC PANEL - Abnormal; Notable for the following components:   Sodium 130 (*)    Chloride 97 (*)    CO2 20 (*)    Glucose, Bld 146 (*)    All other components within normal limits  TROPONIN I    EKG EKG Interpretation  Date/Time:  Tuesday May 12 2018 15:36:41 EDT Ventricular Rate:  109 PR Interval:    QRS Duration: 86 QT Interval:  328 QTC Calculation: 442 R Axis:   87 Text Interpretation:  Sinus tachycardia Right atrial enlargement septal infarct, age indeterminant Confirmed by Raeford RazorKohut, Stephen [phone]([id number]) on 05/12/2018 3:52:50 PM   Radiology Dg Chest 2 View  Result Date: 05/12/2018 CLINICAL DATA:  Shortness of breath for several weeks EXAM: CHEST - 2 VIEW COMPARISON:  02/07/2014 FINDINGS: Cardiac shadow is stable. The lungs are hyperinflated. Chronic  scarring is noted bilaterally. No focal infiltrate or sizable effusion is seen. No acute bony abnormality is noted. IMPRESSION: COPD with chronic scarring. Electronically Signed   By: Alcide CleverMark  Lukens M.D.   On: 05/12/2018 16:25    Procedures Procedures (including critical care time)  Medications Ordered in ED Medications  albuterol (PROVENTIL HFA;VENTOLIN HFA) 108 (90 Base) MCG/ACT inhaler 8 puff (8 puffs Inhalation Given 05/12/18 1605)  aerochamber plus with mask device 1 each (1 each Other Given 05/12/18 1608)  Ipratropium-Albuterol (COMBIVENT) respimat 2 puff (2 puffs Inhalation Given 05/12/18 1656)  predniSONE (DELTASONE) tablet 60 mg (60 mg Oral Given 05/12/18 1605)  magnesium sulfate IVPB 2 g 50 mL (  0 g Intravenous Stopped 05/12/18 1646)     Initial Impression / Assessment and Plan / ED Course  I have reviewed the triage vital signs and the nursing notes.  Pertinent labs & imaging results that were available during my care of the patient were reviewed by me and considered in my medical decision making (see chart for details).        53 yo M with a chief complaint of as of breath.  Feels like his prior COPD.  Clinically the patient has a COPD exacerbation.  With the recent coronavirus pandemic will give MDIs incentive nebs.  Give IV mag and steroids.  Reassess.  The patient was reassessed then breathing much easier.  Talking complete sentences.  No continued tachypnea.  At this point will have him use his albuterol inhaler every 4 hours while awake.  We will have him take the Combivent inhaler home and use that as well.  He has a PCP appointment tomorrow.  Burst of steroids.  6:17 PM:  I have discussed the diagnosis/risks/treatment options with the patient and believe the pt to be eligible for discharge home to follow-up with PCP. We also discussed returning to the ED immediately if new or worsening sx occur. We discussed the sx which are most concerning (e.g., sudden worsening sob, need to  use your inhaler more often than every four hours, fever) that necessitate immediate return. Medications administered to the patient during their visit and any new prescriptions provided to the patient are listed below.  Medications given during this visit Medications  albuterol (PROVENTIL HFA;VENTOLIN HFA) 108 (90 Base) MCG/ACT inhaler 8 puff (8 puffs Inhalation Given 05/12/18 1605)  aerochamber plus with mask device 1 each (1 each Other Given 05/12/18 1608)  Ipratropium-Albuterol (COMBIVENT) respimat 2 puff (2 puffs Inhalation Given 05/12/18 1656)  predniSONE (DELTASONE) tablet 60 mg (60 mg Oral Given 05/12/18 1605)  magnesium sulfate IVPB 2 g 50 mL (0 g Intravenous Stopped 05/12/18 1646)     The patient appears reasonably screen and/or stabilized for discharge and I doubt any other medical condition or other [facility] requiring further screening, evaluation, or treatment in the ED at this time prior to discharge.    Final Clinical Impressions(s) / ED Diagnoses   Final diagnoses:  COPD exacerbation [facility])    ED Discharge Orders         Ordered    predniSONE (DELTASONE) 20 MG tablet     05/12/18 1813           Melene Plan, DO 05/12/18 1817

## 2018-05-12 NOTE — ED Notes (Signed)
Patient verbalizes understanding of discharge instructions. Opportunity for questioning and answers were provided. Armband removed by staff, pt discharged from ED.  

## 2018-05-12 NOTE — ED Notes (Signed)
Patient transported to X-ray 

## 2018-05-12 NOTE — Discharge Instructions (Signed)
Please follow-up with your doctor tomorrow.  Use your inhaler every 4 hours(6 puffs) while awake, return for sudden worsening shortness of breath, or if you need to use your inhaler more often.   You can also take home the Combivent inhaler and use it 2 puffs in the morning and 2 puffs at night.  Take the steroids as prescribed.

## 2018-07-23 ENCOUNTER — Emergency Department (HOSPITAL_COMMUNITY)
Admission: EM | Admit: 2018-07-23 | Discharge: 2018-07-23 | Disposition: A | Discharge status: Home / Self Care | Payer: Self-pay | Attending: Emergency Medicine | Admitting: Emergency Medicine

## 2018-07-23 ENCOUNTER — Emergency Department (HOSPITAL_COMMUNITY): Payer: Self-pay

## 2018-07-23 ENCOUNTER — Other Ambulatory Visit: Payer: Self-pay

## 2018-07-23 DIAGNOSIS — R911 Solitary pulmonary nodule: Secondary | ICD-10-CM | POA: Insufficient documentation

## 2018-07-23 DIAGNOSIS — Z79899 Other long term (current) drug therapy: Secondary | ICD-10-CM | POA: Insufficient documentation

## 2018-07-23 DIAGNOSIS — Z20828 Contact with and (suspected) exposure to other viral communicable diseases: Secondary | ICD-10-CM | POA: Insufficient documentation

## 2018-07-23 DIAGNOSIS — Z85118 Personal history of other malignant neoplasm of bronchus and lung: Secondary | ICD-10-CM | POA: Insufficient documentation

## 2018-07-23 DIAGNOSIS — F17210 Nicotine dependence, cigarettes, uncomplicated: Secondary | ICD-10-CM | POA: Insufficient documentation

## 2018-07-23 DIAGNOSIS — J449 Chronic obstructive pulmonary disease, unspecified: Secondary | ICD-10-CM | POA: Insufficient documentation

## 2018-07-23 DIAGNOSIS — J189 Pneumonia, unspecified organism: Secondary | ICD-10-CM | POA: Insufficient documentation

## 2018-07-23 DIAGNOSIS — Z902 Acquired absence of lung [part of]: Secondary | ICD-10-CM | POA: Insufficient documentation

## 2018-07-23 LAB — CBC WITH DIFFERENTIAL/PLATELET
Abs Immature Granulocytes: 0.01 10*3/uL (ref 0.00–0.07)
Basophils Absolute: 0 10*3/uL (ref 0.0–0.1)
Basophils Relative: 1 %
Eosinophils Absolute: 0.4 10*3/uL (ref 0.0–0.5)
Eosinophils Relative: 8 %
HCT: 39.1 % (ref 39.0–52.0)
Hemoglobin: 12.9 g/dL — ABNORMAL LOW (ref 13.0–17.0)
Immature Granulocytes: 0 %
Lymphocytes Relative: 33 %
Lymphs Abs: 1.7 10*3/uL (ref 0.7–4.0)
MCH: 30.1 pg (ref 26.0–34.0)
MCHC: 33 g/dL (ref 30.0–36.0)
MCV: 91.1 fL (ref 80.0–100.0)
Monocytes Absolute: 0.6 10*3/uL (ref 0.1–1.0)
Monocytes Relative: 11 %
Neutro Abs: 2.6 10*3/uL (ref 1.7–7.7)
Neutrophils Relative %: 47 %
Platelets: 748 10*3/uL — ABNORMAL HIGH (ref 150–400)
RBC: 4.29 MIL/uL (ref 4.22–5.81)
RDW: 14 % (ref 11.5–15.5)
WBC: 5.4 10*3/uL (ref 4.0–10.5)
nRBC: 0 % (ref 0.0–0.2)

## 2018-07-23 LAB — BASIC METABOLIC PANEL
Anion gap: 8 (ref 5–15)
BUN: 10 mg/dL (ref 6–20)
CO2: 25 mmol/L (ref 22–32)
Calcium: 9.4 mg/dL (ref 8.9–10.3)
Chloride: 104 mmol/L (ref 98–111)
Creatinine, Ser: 1.18 mg/dL (ref 0.61–1.24)
GFR calc Af Amer: >60 mL/min (ref >60)
GFR calc non Af Amer: >60 mL/min (ref >60)
Glucose, Bld: 96 mg/dL (ref 70–99)
Potassium: 4.8 mmol/L (ref 3.5–5.1)
Sodium: 137 mmol/L (ref 135–145)

## 2018-07-23 LAB — D-DIMER, QUANTITATIVE: D-Dimer, Quant: 0.55 ug/mL-FEU — ABNORMAL HIGH (ref 0.00–0.50)

## 2018-07-23 LAB — I-STAT TROPONIN, ED: Troponin i, poc: 0 ng/mL (ref 0.00–0.08)

## 2018-07-23 MED ORDER — HYDROCODONE-ACETAMINOPHEN 5-325 MG PO TABS
1.0000 | ORAL_TABLET | Freq: Once | ORAL | Status: AC
  Administered 2018-07-23: 1 via ORAL
  Filled 2018-07-23: qty 1

## 2018-07-23 MED ORDER — IOHEXOL 350 MG/ML SOLN
75.0000 mL | Freq: Once | INTRAVENOUS | Status: AC | PRN
  Administered 2018-07-23: 75 mL via INTRAVENOUS

## 2018-07-23 MED ORDER — AZITHROMYCIN 250 MG PO TABS: 250.0000 mg | ORAL_TABLET | Freq: Every day | ORAL | 0 refills | Status: DC

## 2018-07-23 MED ORDER — AMOXICILLIN-POT CLAVULANATE 875-125 MG PO TABS: 1.0000 | ORAL_TABLET | Freq: Two times a day (BID) | ORAL | 0 refills | Status: DC

## 2018-07-23 NOTE — ED Triage Notes (Signed)
POV d/t R. Sided nonradiating chest pain for x2 weeks

## 2018-07-23 NOTE — ED Provider Notes (Signed)
[facility] Provider Note   CSN: [id number] Arrival date & time: 07/23/18  1443    History   Chief Complaint Chief Complaint  Patient presents with   Chest Pain    HPI Jesse Powell is a 53 y.o. 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 Yesterday he began having pain with breathing, mostly when taking a deep breath. Pain is right chest, below his breast. He has been taking ibuprofen for symptoms without much relief. He recently had a COPD exacerbation and finished amoxicillin on Sunday. His cough and mucus production have largely improved and he feels as though that resolved. He reports a recent stress test last year, just prior to the lobectomy in January 2019, which was normal. He has no known cardiac disease. No hx DVT/PE, no unilateral or bilateral leg swelling or pain, no recent surgery or immobilization. Hx of smoking, reports he stopped 2 years ago. Not on anticoagulation.     The history is provided by the patient.    Past Medical History:  Diagnosis Date   COPD (chronic obstructive pulmonary disease) (HCC)     There are no active problems to display for this patient.   Past Surgical History:  Procedure Laterality Date   LUNG SURGERY  2019        Home Medications    Prior to Admission medications   Medication Sig Start Date End Date Taking? Authorizing Provider  albuterol (PROVENTIL HFA;VENTOLIN HFA) 108 (90 BASE) MCG/ACT inhaler Inhale 2 puffs into the lungs every 2 (two) hours as needed for wheezing or shortness of breath (cough). 02/07/14  Yes Street, [place], PA-C  cetirizine (ZYRTEC) 10 MG tablet Take 10 mg by mouth daily.   Yes [provider]  fluticasone (FLONASE) 50 MCG/ACT nasal spray Place 1 spray into both nostrils  daily.   Yes [provider]  gabapentin (NEURONTIN) 300 MG capsule Take 300-600 mg by mouth 2 (two) times daily.    Yes [provider]  ibuprofen (ADVIL,MOTRIN) 800 MG tablet Take 800 mg by mouth every 8 (eight) hours as needed for moderate pain.   Yes [provider]  naproxen (NAPROSYN) 500 MG tablet Take 500 mg by mouth 2 (two) times daily.   Yes [provider]  amoxicillin-clavulanate (AUGMENTIN) 875-125 MG tablet Take 1 tablet by mouth every 12 (twelve) hours. 07/23/18   Jamiah Recore, [place] N, PA-C  azithromycin (ZITHROMAX) 250 MG tablet Take 1 tablet (250 mg total) by mouth daily. Take first 2 tablets together, then 1 every day until finished. 07/23/18   [place] Bryngelson, [place] N, PA-C  levofloxacin (LEVAQUIN) 750 MG tablet Take 1 tablet (750 mg total) by mouth daily. X 7 days Patient not taking: Reported on 05/12/2018 02/07/14   Street, [place]ode[place]cedes, PA-C  predniSONE (DELTASONE) 20 MG tablet 2 tabs po daily x 4 days Patient not taking: Reported on 07/23/2018 05/12/18   Melene PlanFloyd, Dan, DO    Family History No family history on file.  Social History Social History   Tobacco Use   Smoking status: Current Every Day Smoker    Packs/day: 0.50  Substance Use Topics   Alcohol use: Yes    Alcohol/week: 3.0 standard drinks    Types: 3 Cans of beer per week   Drug use: No  Allergies   Patient has no known allergies.   Review of Systems Review of Systems  Constitutional: Negative for chills and fever.  Respiratory: Positive for shortness of breath. Negative for cough.   Cardiovascular: Positive for chest pain. Negative for palpitations and leg swelling.  All other systems reviewed and are negative.    Physical Exam Updated Vital Signs BP (!) 133/96    Pulse 71    Temp 97.9 F (36.6 C) (Oral)    Resp (!) 24    Ht 5\' 9"  (1.753 m)    Wt 76.7 kg    SpO2 97%    BMI 24.96 kg/m   Physical Exam Vitals signs and nursing note reviewed.  Constitutional:       Appearance: He is well-developed.  HENT:     Head: Normocephalic and atraumatic.     Mouth/Throat:     Mouth: Mucous membranes are moist.  Eyes:     Conjunctiva/sclera: Conjunctivae normal.  Neck:     Musculoskeletal: Normal range of motion and neck supple. No muscular tenderness.  Cardiovascular:     Rate and Rhythm: Normal rate and regular rhythm.     Pulses: Normal pulses.     Heart sounds: Normal heart sounds.  Pulmonary:     Effort: Pulmonary effort is normal. No respiratory distress.     Breath sounds: Normal breath sounds.     Comments: Speaking in full sentences Chest:     Chest wall: Tenderness (Right lower anterior chest wall, no crepitus.  No skin changes.) present.  Abdominal:     General: Bowel sounds are normal.     Palpations: Abdomen is soft.     Tenderness: There is no abdominal tenderness. There is no guarding or rebound.  Musculoskeletal:     Right lower leg: No edema.     Left lower leg: No edema.  Skin:    General: Skin is warm.  Neurological:     Mental Status: He is alert.  Psychiatric:        Behavior: Behavior normal.      ED Treatments / Results  Labs (all labs ordered are listed, but only abnormal results are displayed) Labs Reviewed  CBC WITH DIFFERENTIAL/PLATELET - Abnormal; Notable for the following components:      Result Value   Hemoglobin 12.9 (*)    Platelets 748 (*)    All other components within normal limits  D-DIMER, QUANTITATIVE (NOT [facility]) - Abnormal; Notable for the following components:   D-Dimer, Quant 0.55 (*)    All other components within normal limits  NOVEL CORONAVIRUS, NAA (HOSPITAL ORDER, SEND-OUT TO REF LAB)  BASIC METABOLIC PANEL  I-STAT TROPONIN, ED    EKG None  Radiology Dg Chest 2 View  Result Date: 07/23/2018 CLINICAL DATA:  Right-sided chest pain and shortness of breath EXAM: CHEST - 2 VIEW COMPARISON:  05/12/2018 FINDINGS: Cardiac shadows within normal limits. The lungs are hyperinflated consistent  with COPD. Mild scarring is again noted in the bases bilaterally. No focal infiltrate or effusion is seen. IMPRESSION: COPD and chronic scarring.  No acute abnormality noted. Electronically Signed   By: Alcide CleverMark  Lukens M.D.   On: 07/23/2018 15:52   Ct Angio Chest Pe W/cm &/or Wo Cm  Result Date: 07/23/2018 CLINICAL DATA:  Chest pain with positive D-dimer EXAM: CT ANGIOGRAPHY CHEST WITH CONTRAST TECHNIQUE: Multidetector CT imaging of the chest was performed using the standard protocol during bolus administration of intravenous contrast. Multiplanar CT image reconstructions and MIPs were  obtained to evaluate the vascular anatomy. CONTRAST:  75mL OMNIPAQUE IOHEXOL 350 MG/ML SOLN COMPARISON:  Chest radiograph from 07/23/2018 FINDINGS: Cardiovascular: No filling defect is identified in the pulmonary arterial tree to suggest pulmonary embolus. Contrast was timed for pulmonary arterial opacification and there is little to no contrast in the systemic arterial supply at the time of imaging. Mild atherosclerotic calcification of the aortic arch. Mild ectasia of the ascending thoracic aorta at 3.6 cm. Mediastinum/Nodes: 7 mm AP window lymph node, image 53/12. No overt pathologic adenopathy identified. Lungs/Pleura: Emphysema is primarily paraseptal bulla. Biapical pleuroparenchymal scarring. Considerable bilateral cystic bronchiectasis. No pneumothorax. And irregular and somewhat ill-defined nodular density in the right upper lobe measures 1.1 by 0.9 by 0.6. Bandlike density along the upper margin of the minor fissure noted along with volume loss and airspace opacities in both lower lobes. Indistinct scattered airspace opacities in the left lower lobe along with peribronchovascular volume loss in the left upper lobe and associated scarring. No pleural effusion. Upper Abdomen: Hypodense lesion of the left kidney upper pole, potentially up to about 2.6 cm in diameter, nonspecific but possibly a cyst. Musculoskeletal:  Unremarkable Review of the MIP images confirms the above findings. IMPRESSION: 1. No filling defect is identified in the pulmonary arterial tree to suggest pulmonary embolus. 2. Patchy airspace opacities in both lung bases, aspiration pneumonitis or pneumonia not excluded. 3. Paraseptal emphysema and severe cystic bronchiectasis. 4. There is some bandlike densities in the right upper lobe with some nodular components which likely merit surveillance, including a 1.1 by 0.9 cm right upper lobe nodule on image 85/14. Consider surveillance chest CT in 3 months time to exclude the unlikely possibility of neoplastic process. 5. Aortic Atherosclerosis (ICD10-I70.0) and Emphysema (ICD10-J43.9). Electronically Signed   By: Gaylyn RongWalter  Liebkemann M.D.   On: 07/23/2018 18:10    Procedures Procedures (including critical care time)  Medications Ordered in ED Medications  HYDROcodone-acetaminophen (NORCO/VICODIN) 5-325 MG per tablet 1 tablet (1 tablet Oral Given 07/23/18 1656)  iohexol (OMNIPAQUE) 350 MG/ML injection 75 mL (75 mLs Intravenous Contrast Given 07/23/18 1726)    Jesse CloudRamon Powell was evaluated in Emergency Department on 07/23/2018 for the symptoms described in the history of present illness. He was evaluated in the context of the global COVID-19 pandemic, which necessitated consideration that the patient might be at risk for infection with the SARS-CoV-2 virus that causes COVID-19. Institutional protocols and algorithms that pertain to the evaluation of patients at risk for COVID-19 are in a state of rapid change based on information released by regulatory bodies including the CDC and federal and state organizations. These policies and algorithms were followed during the patient's care in the ED.  Initial Impression / Assessment and Plan / ED Course  I have reviewed the triage vital signs and the nursing notes.  Pertinent labs & imaging results that were available during my care of the patient were reviewed by  me and considered in my medical decision making (see chart for details).        Patient with past medical history of COPD status post lobectomy due to adenocarcinoma, presenting to the emergency department with complaint of right-sided chest pain for 2 weeks, worsening x2 days.  He also feels some shortness of breath with exertion.  Reports he was recently treated for COPD exacerbation and "mucus buildup" with amoxicillin, he states he finished that antibiotic on Sunday.  His cough and mucus are much better, however he began having this pain that is constant.  No  infectious symptoms.  On exam, vitals are stable, O2 saturation 99% on room air, normal work of breathing, chest pain is reproducible on exam.  Labs ordered reveal a slightly elevated d-dimer.  Leukocytosis.  Will follow with CTA.  CTA revealing no evidence of PE, however bilateral basilar patchy infiltrates suspicious for possible pneumonitis versus pneumonia.  Incidental finding of pulmonary nodules also present.  Discussed all of these findings with the patient. Will treat for possible pneumonia and cover for possible aspiration with Augmentin.  Patient instructed follow-up with his pulmonologist for repeat imaging in about 3 months regarding the pulmonary nodule.  Sent COVID test. In the setting of the current COVID-19 pandemic, patient instructed to self-isolate at home until at least 7 days after symptoms began and then 72 hours after cough improves and is afebrile, without the assistance of antipyretics.  Patient instructed to follow-up closely with PCP.  Return precautions discussed.  Patient discussed with Dr. Rex Kras, who guided care plan.  Discussed results, findings, treatment and follow up. Patient advised of return precautions. Patient verbalized understanding and agreed with plan.  Final Clinical Impressions(s) / ED Diagnoses   Final diagnoses:  Pneumonitis  Lung nodule    ED Discharge Orders         Ordered     azithromycin (ZITHROMAX) 250 MG tablet  Daily     07/23/18 1940    amoxicillin-clavulanate (AUGMENTIN) 875-125 MG tablet  Every 12 hours     07/23/18 1940           Jabre Heo, [place] N, PA-C 07/23/18 2041    Little, Wenda Overland, MD 07/23/18 2056

## 2018-07-23 NOTE — Discharge Instructions (Addendum)
Please take the antibiotics as prescribed until gone. You may be developing a pneumonia. Discuss your incidental findings with your lung doctor regarding the lung nodule. It is recommended you have repeat imaging in 3 months. You can take over-the-counter medications as needed for your discomfort. Follow up with your primary care provider regarding your visit today. It is strongly recommended you self-isolate at home until you know the results of your coronavirus test.  Return to the ER if you developed severely worsening shortness of breath or chest pain.

## 2018-07-25 LAB — NOVEL CORONAVIRUS, NAA (HOSP ORDER, SEND-OUT TO REF LAB; TAT 18-24 HRS): SARS-CoV-2, NAA: NOT DETECTED

## 2018-12-22 ENCOUNTER — Emergency Department (HOSPITAL_COMMUNITY): Payer: No Typology Code available for payment source

## 2018-12-22 ENCOUNTER — Encounter (HOSPITAL_COMMUNITY): Payer: Self-pay

## 2018-12-22 ENCOUNTER — Other Ambulatory Visit: Payer: Self-pay

## 2018-12-22 ENCOUNTER — Emergency Department (HOSPITAL_COMMUNITY)
Admission: EM | Admit: 2018-12-22 | Discharge: 2018-12-22 | Disposition: A | Discharge status: Home / Self Care | Payer: No Typology Code available for payment source | Attending: Emergency Medicine | Admitting: Emergency Medicine

## 2018-12-22 DIAGNOSIS — R0602 Shortness of breath: Secondary | ICD-10-CM | POA: Diagnosis present

## 2018-12-22 DIAGNOSIS — J441 Chronic obstructive pulmonary disease with (acute) exacerbation: Secondary | ICD-10-CM | POA: Diagnosis not present

## 2018-12-22 DIAGNOSIS — Z79899 Other long term (current) drug therapy: Secondary | ICD-10-CM | POA: Diagnosis not present

## 2018-12-22 DIAGNOSIS — Z20828 Contact with and (suspected) exposure to other viral communicable diseases: Secondary | ICD-10-CM | POA: Insufficient documentation

## 2018-12-22 DIAGNOSIS — J189 Pneumonia, unspecified organism: Secondary | ICD-10-CM

## 2018-12-22 DIAGNOSIS — F17210 Nicotine dependence, cigarettes, uncomplicated: Secondary | ICD-10-CM | POA: Diagnosis not present

## 2018-12-22 LAB — CBC WITH DIFFERENTIAL/PLATELET
Abs Immature Granulocytes: 0.01 10*3/uL (ref 0.00–0.07)
Basophils Absolute: 0 10*3/uL (ref 0.0–0.1)
Basophils Relative: 0 %
Eosinophils Absolute: 0.3 10*3/uL (ref 0.0–0.5)
Eosinophils Relative: 5 %
HCT: 43.3 % (ref 39.0–52.0)
Hemoglobin: 15 g/dL (ref 13.0–17.0)
Immature Granulocytes: 0 %
Lymphocytes Relative: 17 %
Lymphs Abs: 0.8 10*3/uL (ref 0.7–4.0)
MCH: 31 pg (ref 26.0–34.0)
MCHC: 34.6 g/dL (ref 30.0–36.0)
MCV: 89.5 fL (ref 80.0–100.0)
Monocytes Absolute: 0.7 10*3/uL (ref 0.1–1.0)
Monocytes Relative: 13 %
Neutro Abs: 3.2 10*3/uL (ref 1.7–7.7)
Neutrophils Relative %: 65 %
Platelets: 472 10*3/uL — ABNORMAL HIGH (ref 150–400)
RBC: 4.84 MIL/uL (ref 4.22–5.81)
RDW: 14 % (ref 11.5–15.5)
WBC: 5 10*3/uL (ref 4.0–10.5)
nRBC: 0 % (ref 0.0–0.2)

## 2018-12-22 LAB — COMPREHENSIVE METABOLIC PANEL
ALT: 20 U/L (ref 0–44)
AST: 26 U/L (ref 15–41)
Albumin: 3.5 g/dL (ref 3.5–5.0)
Alkaline Phosphatase: 91 U/L (ref 38–126)
Anion gap: 9 (ref 5–15)
BUN: 7 mg/dL (ref 6–20)
CO2: 25 mmol/L (ref 22–32)
Calcium: 9.5 mg/dL (ref 8.9–10.3)
Chloride: 101 mmol/L (ref 98–111)
Creatinine, Ser: 1.02 mg/dL (ref 0.61–1.24)
GFR calc Af Amer: >60 mL/min (ref >60)
GFR calc non Af Amer: >60 mL/min (ref >60)
Glucose, Bld: 101 mg/dL — ABNORMAL HIGH (ref 70–99)
Potassium: 4 mmol/L (ref 3.5–5.1)
Sodium: 135 mmol/L (ref 135–145)
Total Bilirubin: 0.7 mg/dL (ref 0.3–1.2)
Total Protein: 8 g/dL (ref 6.5–8.1)

## 2018-12-22 LAB — SARS CORONAVIRUS 2 (TAT 6-24 HRS): SARS Coronavirus 2: NEGATIVE

## 2018-12-22 LAB — TROPONIN I (HIGH SENSITIVITY): Troponin I (High Sensitivity): 3 ng/L (ref <18)

## 2018-12-22 MED ORDER — FLUTICASONE PROPIONATE 50 MCG/ACT NA SUSP: 2.0000 | Freq: Every day | NASAL | 0 refills | Status: DC

## 2018-12-22 MED ORDER — PREDNISONE 10 MG PO TABS: 40.0000 mg | ORAL_TABLET | Freq: Every day | ORAL | 0 refills | Status: AC

## 2018-12-22 MED ORDER — AMOXICILLIN 500 MG PO CAPS: 1000.0000 mg | ORAL_CAPSULE | Freq: Three times a day (TID) | ORAL | 0 refills | Status: AC

## 2018-12-22 MED ORDER — AMOXICILLIN 500 MG PO CAPS
1000.0000 mg | ORAL_CAPSULE | Freq: Once | ORAL | Status: AC
  Administered 2018-12-22: 1000 mg via ORAL
  Filled 2018-12-22: qty 2

## 2018-12-22 MED ORDER — ALBUTEROL SULFATE HFA 108 (90 BASE) MCG/ACT IN AERS
4.0000 | INHALATION_SPRAY | Freq: Once | RESPIRATORY_TRACT | Status: AC
  Administered 2018-12-22: 4 via RESPIRATORY_TRACT
  Filled 2018-12-22: qty 6.7

## 2018-12-22 MED ORDER — AMOXICILLIN 500 MG PO CAPS: 1000.0000 mg | ORAL_CAPSULE | Freq: Three times a day (TID) | ORAL | 0 refills | Status: DC

## 2018-12-22 MED ORDER — PREDNISONE 20 MG PO TABS
60.0000 mg | ORAL_TABLET | Freq: Once | ORAL | Status: AC
  Administered 2018-12-22: 12:00:00 60 mg via ORAL
  Filled 2018-12-22: qty 3

## 2018-12-22 MED ORDER — DOXYCYCLINE HYCLATE 100 MG PO CAPS: 100.0000 mg | ORAL_CAPSULE | Freq: Two times a day (BID) | ORAL | 0 refills | Status: DC

## 2018-12-22 MED ORDER — DOXYCYCLINE HYCLATE 100 MG PO CAPS: 100.0000 mg | ORAL_CAPSULE | Freq: Two times a day (BID) | ORAL | 0 refills | Status: AC

## 2018-12-22 MED ORDER — DOXYCYCLINE HYCLATE 100 MG PO TABS
100.0000 mg | ORAL_TABLET | Freq: Once | ORAL | Status: AC
  Administered 2018-12-22: 14:00:00 100 mg via ORAL
  Filled 2018-12-22: qty 1

## 2018-12-22 NOTE — ED Provider Notes (Signed)
[facility] EMERGENCY DEPARTMENT Provider Note   CSN: [id number] Arrival date & time: 12/22/18  1013     History   Chief Complaint Chief Complaint  Patient presents with   Shortness of Breath   Cough    HPI Lonzo CloudRamon Wander is a 53 y.o. male with history of COPD presents today for evaluation of acute onset, persistent nasal congestion, productive cough, wheezing and shortness of breath for 2 days.  Cough productive of green sputum.  Reports symptoms are worse at night when he lays flat mostly due to worsening cough at night.  Will feel short of breath with persistent cough and a little with ambulation.  Notes chest soreness due to persistent cough.  Denies fever, abdominal pain, nausea, or vomiting.  Has tried 3 puffs of albuterol daily for the last 2 days with temporary improvement in symptoms.  He is a former smoker quit 2 years ago, denies recreational drug use.  He is status post lobectomy of the lung.  States he feels like he typically feels when he has pneumonia.  No known sick contacts.  No known Covid exposures.     The history is provided by the patient.    Past Medical History:  Diagnosis Date   COPD (chronic obstructive pulmonary disease) (HCC)     There are no active problems to display for this patient.   Past Surgical History:  Procedure Laterality Date   LUNG SURGERY  2019        Home Medications    Prior to Admission medications   Medication Sig Start Date End Date Taking? Authorizing Provider  albuterol (PROVENTIL HFA;VENTOLIN HFA) 108 (90 BASE) MCG/ACT inhaler Inhale 2 puffs into the lungs every 2 (two) hours as needed for wheezing or shortness of breath (cough). 02/07/14   Street, [place], PA-C  amoxicillin (AMOXIL) 500 MG capsule Take 2 capsules (1,000 mg total) by mouth 3 (three) times daily for 5 days. 12/22/18 12/27/18  Michela PitcherFawze, Leeon Makar A, PA-C  cetirizine (ZYRTEC) 10 MG tablet Take 10 mg by mouth daily.    [provider]   doxycycline (VIBRAMYCIN) 100 MG capsule Take 1 capsule (100 mg total) by mouth 2 (two) times daily for 5 days. 12/22/18 12/27/18  Keaton Stirewalt A, PA-C  fluticasone (FLONASE) 50 MCG/ACT nasal spray Place 2 sprays into both nostrils daily. 12/22/18   Keryl Gholson A, PA-C  gabapentin (NEURONTIN) 300 MG capsule Take 300-600 mg by mouth 2 (two) times daily.     [provider]  ibuprofen (ADVIL,MOTRIN) 800 MG tablet Take 800 mg by mouth every 8 (eight) hours as needed for moderate pain.    [provider]  naproxen (NAPROSYN) 500 MG tablet Take 500 mg by mouth 2 (two) times daily.    [provider]  predniSONE (DELTASONE) 10 MG tablet Take 4 tablets (40 mg total) by mouth daily with breakfast for 4 days. 12/22/18 12/26/18  Jeanie SewerFawze, Palyn Scrima A, PA-C    Family History History reviewed. No pertinent family history.  Social History Social History   Tobacco Use   Smoking status: Current Every Day Smoker    Packs/day: 0.50   Smokeless tobacco: Never Used  Substance Use Topics   Alcohol use: Yes    Alcohol/week: 3.0 standard drinks    Types: 3 Cans of beer per week   Drug use: No     Allergies   Patient has no known allergies.   Review of Systems Review of Systems  Constitutional: Negative for chills  and fever.  HENT: Positive for congestion. Negative for sore throat.   Respiratory: Positive for cough, chest tightness, shortness of breath and wheezing.   Cardiovascular: Positive for chest pain (with cough only). Negative for palpitations and leg swelling.  Gastrointestinal: Negative for abdominal pain, nausea and vomiting.  All other systems reviewed and are negative.    Physical Exam Updated Vital Signs BP (!) 135/97 (BP Location: Right Arm)    Pulse 92    Temp 97.8 F (36.6 C) (Oral)    Resp 20    Ht 5\' 9"  (1.753 m)    Wt 72.6 kg    SpO2 95%    BMI 23.63 kg/m   Physical Exam Vitals signs and nursing note reviewed.  Constitutional:      General: He is  not in acute distress.    Appearance: He is well-developed.  HENT:     Head: Normocephalic and atraumatic.     Comments: No frontal or maxillary sinus tenderness. Eyes:     General:        Right eye: No discharge.        Left eye: No discharge.     Conjunctiva/sclera: Conjunctivae normal.  Neck:     Musculoskeletal: Normal range of motion and neck supple.     Vascular: No JVD.     Trachea: No tracheal deviation.  Cardiovascular:     Rate and Rhythm: Normal rate and regular rhythm.     Pulses: Normal pulses.     Comments: 2+ radial and DP/PT pulses bilaterally, Homans sign absent bilaterally, no lower extremity edema, no palpable cords, compartments are soft  Pulmonary:     Effort: Pulmonary effort is normal.     Breath sounds: Examination of the right-lower field reveals rales. Examination of the left-lower field reveals rales. Wheezing and rales present.     Comments: Diffuse expiratory wheezes.  Bibasilar rales noted.  Speaking in full sentences without difficulty, SPO2 saturations 96% on room air. Chest:     Chest wall: Tenderness present.     Comments: Diffuse anterior and lateral chest wall tenderness with no deformity, crepitus, ecchymosis, or flail segment Abdominal:     General: There is no distension.     Palpations: Abdomen is soft.     Tenderness: There is no abdominal tenderness. There is no guarding.  Musculoskeletal:     Right lower leg: He exhibits no tenderness. No edema.     Left lower leg: He exhibits no tenderness. No edema.  Skin:    General: Skin is warm and dry.     Findings: No erythema.  Neurological:     Mental Status: He is alert.  Psychiatric:        Behavior: Behavior normal.      ED Treatments / Results  Labs (all labs ordered are listed, but only abnormal results are displayed) Labs Reviewed  CBC WITH DIFFERENTIAL/PLATELET - Abnormal; Notable for the following components:      Result Value   Platelets 472 (*)    All other components  within normal limits  COMPREHENSIVE METABOLIC PANEL - Abnormal; Notable for the following components:   Glucose, Bld 101 (*)    All other components within normal limits  SARS CORONAVIRUS 2 (TAT 6-24 HRS)  TROPONIN I (HIGH SENSITIVITY)    EKG None  Radiology Dg Chest 2 View  Result Date: 12/22/2018 CLINICAL DATA:  Cough, congestion, wheezing for 4 days EXAM: CHEST - 2 VIEW COMPARISON:  07/23/2018 FINDINGS: Bilateral lower lobe  and left upper lobe chronic interstitial lung disease. No new focal consolidation, pleural effusion or pneumothorax. Stable cardiomediastinal silhouette. No aggressive osseous lesion. IMPRESSION: Bilateral lower lobe and left upper lobe chronic interstitial disease similar in appearance to the prior CT of the chest dated 07/23/2018. Superimposed infection at either lung base cannot be completely excluded. Electronically Signed   By: Kathreen Devoid   On: 12/22/2018 11:20    Procedures Procedures (including critical care time)  Medications Ordered in ED Medications  predniSONE (DELTASONE) tablet 60 mg (60 mg Oral Given 12/22/18 1201)  albuterol (VENTOLIN HFA) 108 (90 Base) MCG/ACT inhaler 4 puff (4 puffs Inhalation Given 12/22/18 1201)  doxycycline (VIBRA-TABS) tablet 100 mg (100 mg Oral Given 12/22/18 1420)  amoxicillin (AMOXIL) capsule 1,000 mg (1,000 mg Oral Given 12/22/18 1420)     Initial Impression / Assessment and Plan / ED Course  I have reviewed the triage vital signs and the nursing notes.  Pertinent labs & imaging results that were available during my care of the patient were reviewed by me and considered in my medical decision making (see chart for details).        Nicholi Ghuman was evaluated in Emergency Department on 12/22/2018 for the symptoms described in the history of present illness. He was evaluated in the context of the global COVID-19 pandemic, which necessitated consideration that the patient might be at risk for infection with the  SARS-CoV-2 virus that causes COVID-19. Institutional protocols and algorithms that pertain to the evaluation of patients at risk for COVID-19 are in a state of rapid change based on information released by regulatory bodies including the CDC and federal and state organizations. These policies and algorithms were followed during the patient's care in the ED.  Patient presenting for evaluation of nasal congestion, productive cough, wheezing.  He is afebrile, vital signs are stable.  He is nontoxic in appearance.  On initial examination has diffuse expiratory wheezes on auscultation of the lungs as well as bibasilar Rales.  Chest x-ray shows possible superimposed infection at the lung bases also chronic bilateral lower lobe and left upper lobe chronic interstitial disease.  Lab work reviewed by me shows no leukocytosis, no anemia, no metabolic derangements, no renal insufficiency.  He has tenderness to palpation of the chest wall due to persistent cough.  A troponin was obtained in triage which was negative.  His symptoms do not sound cardiac in etiology and I do not think that he requires serial troponins.  He was ambulated in the ED with stable SPO2 saturations.  Will obtain outpatient Covid test.  He was given puffs of albuterol in the ED as well as p.o. prednisone with improvement in symptoms and resolution of wheezing.  Unable to give a breathing treatment in the ED due to the COVID-19 pandemic.  Doubt PE, cardiac tamponade, esophageal rupture, pneumothorax.  On reevaluation he is resting comfortably in no apparent distress.  Tolerating p.o. food and fluids without difficulty.  No evidence of respiratory distress.  Given history of COPD and concurrent smoking will start on antibiotics for management of community-acquired pneumonia.  Will obtain outpatient Covid swab.  Discussed quarantining at home per current CDC guidelines.  Discussed strict ED return precautions. Patient verbalized understanding of and  agreement with plan and is safe for discharge home at this time.  No complaints prior to discharge.  Final Clinical Impressions(s) / ED Diagnoses   Final diagnoses:  COPD exacerbation ([place])  Community acquired pneumonia, unspecified laterality    ED  Discharge Orders         Ordered    amoxicillin (AMOXIL) 500 MG capsule  3 times daily,   Status:  Discontinued     12/22/18 1414    doxycycline (VIBRAMYCIN) 100 MG capsule  2 times daily,   Status:  Discontinued     12/22/18 1414    predniSONE (DELTASONE) 10 MG tablet  Daily with breakfast     12/22/18 1417    fluticasone (FLONASE) 50 MCG/ACT nasal spray  Daily     12/22/18 1417    amoxicillin (AMOXIL) 500 MG capsule  3 times daily     12/22/18 1417    doxycycline (VIBRAMYCIN) 100 MG capsule  2 times daily     12/22/18 1417           Jeanie Sewer, PA-C 12/22/18 1446    Bethann Berkshire, MD 12/24/18 1812

## 2018-12-22 NOTE — ED Notes (Addendum)
Pt ambulated around the room. Pt reported feeling SOB. Pulse oxygen stayed above 95%.

## 2018-12-22 NOTE — ED Triage Notes (Signed)
Pt presents w/productive cough x2 days. States he has COPD, feels he has pneumonia. Pt states he was unable to cough, blow his nose, use his pump all at one time.

## 2018-12-22 NOTE — Discharge Instructions (Addendum)
Please take all of your antibiotics until finished!   Take your antibiotics with food.  Common side effects of antibiotics include nausea, vomiting, abdominal discomfort, and diarrhea. You may help offset some of this with probiotics which you can buy or get in yogurt. Do not eat  or take the probiotics until 2 hours after your antibiotic.    Start taking prednisone as prescribed beginning tomorrow.  You received the first dose in the emergency department today.  Use the albuterol inhaler 1 to 2 puffs every 4-6 hours as needed for shortness of breath and chest tightness/wheezing.  You can take 1 to 2 tablets of Tylenol every 6 hours as needed for pain or fever.  Use fluticasone nasal spray for nasal congestion.  Your Covid test will result within 24 to 48 hours.  You can check your results on MyChart.  Quarantine at home for the next 14 days from when your symptoms began.  This is the current recommendation from the [facility].  Return to the emergency department if any concerning signs or symptoms develop such as shortness of breath, loss of consciousness, high fevers, persistent vomiting, or worsening pain.

## 2020-06-02 ENCOUNTER — Emergency Department (HOSPITAL_COMMUNITY): Payer: No Typology Code available for payment source

## 2020-06-02 ENCOUNTER — Other Ambulatory Visit: Payer: Self-pay

## 2020-06-02 ENCOUNTER — Inpatient Hospital Stay (HOSPITAL_COMMUNITY)
Admission: EM | Admit: 2020-06-02 | Discharge: 2020-06-04 | DRG: 190 | Disposition: A | Discharge status: Home / Self Care | Payer: No Typology Code available for payment source | Attending: Internal Medicine | Admitting: Internal Medicine

## 2020-06-02 ENCOUNTER — Encounter (HOSPITAL_COMMUNITY): Payer: Self-pay | Admitting: Internal Medicine

## 2020-06-02 DIAGNOSIS — Z85118 Personal history of other malignant neoplasm of bronchus and lung: Secondary | ICD-10-CM | POA: Diagnosis not present

## 2020-06-02 DIAGNOSIS — E869 Volume depletion, unspecified: Secondary | ICD-10-CM | POA: Diagnosis present

## 2020-06-02 DIAGNOSIS — R0902 Hypoxemia: Secondary | ICD-10-CM

## 2020-06-02 DIAGNOSIS — D751 Secondary polycythemia: Secondary | ICD-10-CM | POA: Diagnosis present

## 2020-06-02 DIAGNOSIS — F17210 Nicotine dependence, cigarettes, uncomplicated: Secondary | ICD-10-CM | POA: Diagnosis present

## 2020-06-02 DIAGNOSIS — J9601 Acute respiratory failure with hypoxia: Secondary | ICD-10-CM | POA: Diagnosis present

## 2020-06-02 DIAGNOSIS — Z20822 Contact with and (suspected) exposure to covid-19: Secondary | ICD-10-CM | POA: Diagnosis present

## 2020-06-02 DIAGNOSIS — R0602 Shortness of breath: Secondary | ICD-10-CM | POA: Diagnosis not present

## 2020-06-02 DIAGNOSIS — J441 Chronic obstructive pulmonary disease with (acute) exacerbation: Principal | ICD-10-CM | POA: Diagnosis present

## 2020-06-02 DIAGNOSIS — E871 Hypo-osmolality and hyponatremia: Secondary | ICD-10-CM | POA: Diagnosis present

## 2020-06-02 DIAGNOSIS — Z79899 Other long term (current) drug therapy: Secondary | ICD-10-CM | POA: Diagnosis not present

## 2020-06-02 LAB — COMPREHENSIVE METABOLIC PANEL
ALT: 14 U/L (ref 0–44)
AST: 25 U/L (ref 15–41)
Albumin: 3.6 g/dL (ref 3.5–5.0)
Alkaline Phosphatase: 71 U/L (ref 38–126)
Anion gap: 8 (ref 5–15)
BUN: 11 mg/dL (ref 6–20)
CO2: 28 mmol/L (ref 22–32)
Calcium: 9.2 mg/dL (ref 8.9–10.3)
Chloride: 97 mmol/L — ABNORMAL LOW (ref 98–111)
Creatinine, Ser: 1.01 mg/dL (ref 0.61–1.24)
GFR, Estimated: >60 mL/min (ref >60)
Glucose, Bld: 108 mg/dL — ABNORMAL HIGH (ref 70–99)
Potassium: 3.9 mmol/L (ref 3.5–5.1)
Sodium: 133 mmol/L — ABNORMAL LOW (ref 135–145)
Total Bilirubin: 0.5 mg/dL (ref 0.3–1.2)
Total Protein: 7 g/dL (ref 6.5–8.1)

## 2020-06-02 LAB — CBC WITH DIFFERENTIAL/PLATELET
Abs Immature Granulocytes: 0.01 10*3/uL (ref 0.00–0.07)
Basophils Absolute: 0.1 10*3/uL (ref 0.0–0.1)
Basophils Relative: 1 %
Eosinophils Absolute: 0.4 10*3/uL (ref 0.0–0.5)
Eosinophils Relative: 7 %
HCT: 50.8 % (ref 39.0–52.0)
Hemoglobin: 17.3 g/dL — ABNORMAL HIGH (ref 13.0–17.0)
Immature Granulocytes: 0 %
Lymphocytes Relative: 29 %
Lymphs Abs: 1.7 10*3/uL (ref 0.7–4.0)
MCH: 31.3 pg (ref 26.0–34.0)
MCHC: 34.1 g/dL (ref 30.0–36.0)
MCV: 91.9 fL (ref 80.0–100.0)
Monocytes Absolute: 0.6 10*3/uL (ref 0.1–1.0)
Monocytes Relative: 11 %
Neutro Abs: 3.1 10*3/uL (ref 1.7–7.7)
Neutrophils Relative %: 52 %
Platelets: 422 10*3/uL — ABNORMAL HIGH (ref 150–400)
RBC: 5.53 MIL/uL (ref 4.22–5.81)
RDW: 13.3 % (ref 11.5–15.5)
WBC: 5.9 10*3/uL (ref 4.0–10.5)
nRBC: 0 % (ref 0.0–0.2)

## 2020-06-02 LAB — RESP PANEL BY RT-PCR (FLU A&B, COVID) ARPGX2
Influenza A by PCR: NEGATIVE
Influenza B by PCR: NEGATIVE
SARS Coronavirus 2 by RT PCR: NEGATIVE

## 2020-06-02 LAB — TROPONIN I (HIGH SENSITIVITY)
Troponin I (High Sensitivity): 3 ng/L (ref <18)
Troponin I (High Sensitivity): 4 ng/L (ref <18)

## 2020-06-02 LAB — POC SARS CORONAVIRUS 2 AG -  ED: SARSCOV2ONAVIRUS 2 AG: NEGATIVE

## 2020-06-02 MED ORDER — AZITHROMYCIN 250 MG PO TABS: 250.0000 mg | ORAL_TABLET | ORAL | Status: DC

## 2020-06-02 MED ORDER — IPRATROPIUM BROMIDE 0.02 % IN SOLN
0.5000 mg | Freq: Once | RESPIRATORY_TRACT | Status: AC
  Administered 2020-06-02: 0.5 mg via RESPIRATORY_TRACT
  Filled 2020-06-02: qty 2.5

## 2020-06-02 MED ORDER — LEVOFLOXACIN 500 MG PO TABS
500.0000 mg | ORAL_TABLET | Freq: Every day | ORAL | Status: AC
  Administered 2020-06-02 – 2020-06-04 (×3): 500 mg via ORAL
  Filled 2020-06-02 (×4): qty 1

## 2020-06-02 MED ORDER — ENOXAPARIN SODIUM 40 MG/0.4ML ~~LOC~~ SOLN
40.0000 mg | SUBCUTANEOUS | Status: DC
  Administered 2020-06-02: 40 mg via SUBCUTANEOUS
  Filled 2020-06-02 (×2): qty 0.4

## 2020-06-02 MED ORDER — GUAIFENESIN ER 600 MG PO TB12
600.0000 mg | ORAL_TABLET | Freq: Two times a day (BID) | ORAL | Status: DC
  Administered 2020-06-02 – 2020-06-04 (×5): 600 mg via ORAL
  Filled 2020-06-02 (×5): qty 1

## 2020-06-02 MED ORDER — ACETAMINOPHEN 650 MG RE SUPP: 650.0000 mg | Freq: Four times a day (QID) | RECTAL | Status: DC | PRN

## 2020-06-02 MED ORDER — ALBUTEROL SULFATE HFA 108 (90 BASE) MCG/ACT IN AERS
8.0000 | INHALATION_SPRAY | Freq: Once | RESPIRATORY_TRACT | Status: AC
  Administered 2020-06-02: 8 via RESPIRATORY_TRACT
  Filled 2020-06-02: qty 6.7

## 2020-06-02 MED ORDER — GABAPENTIN 300 MG PO CAPS: 300.0000 mg | ORAL_CAPSULE | Freq: Two times a day (BID) | ORAL | Status: DC

## 2020-06-02 MED ORDER — ROPINIROLE HCL 0.25 MG PO TABS
0.2500 mg | ORAL_TABLET | Freq: Every day | ORAL | Status: DC
  Administered 2020-06-02 – 2020-06-03 (×2): 0.25 mg via ORAL
  Filled 2020-06-02 (×2): qty 1

## 2020-06-02 MED ORDER — METHYLPREDNISOLONE SODIUM SUCC 40 MG IJ SOLR
40.0000 mg | Freq: Four times a day (QID) | INTRAMUSCULAR | Status: AC
  Administered 2020-06-02 – 2020-06-03 (×4): 40 mg via INTRAVENOUS
  Filled 2020-06-02 (×4): qty 1

## 2020-06-02 MED ORDER — ADULT MULTIVITAMIN W/MINERALS CH
1.0000 | ORAL_TABLET | Freq: Every day | ORAL | Status: DC
  Administered 2020-06-02 – 2020-06-04 (×3): 1 via ORAL
  Filled 2020-06-02 (×3): qty 1

## 2020-06-02 MED ORDER — PREDNISONE 20 MG PO TABS
40.0000 mg | ORAL_TABLET | Freq: Every day | ORAL | Status: DC
  Administered 2020-06-03 – 2020-06-04 (×2): 40 mg via ORAL
  Filled 2020-06-02 (×2): qty 2

## 2020-06-02 MED ORDER — ALBUTEROL SULFATE (2.5 MG/3ML) 0.083% IN NEBU: 2.5000 mg | INHALATION_SOLUTION | RESPIRATORY_TRACT | Status: DC | PRN

## 2020-06-02 MED ORDER — SENNA 8.6 MG PO TABS
1.0000 | ORAL_TABLET | Freq: Two times a day (BID) | ORAL | Status: DC
  Administered 2020-06-02 – 2020-06-04 (×5): 8.6 mg via ORAL
  Filled 2020-06-02 (×5): qty 1

## 2020-06-02 MED ORDER — ONDANSETRON HCL 4 MG PO TABS: 4.0000 mg | ORAL_TABLET | Freq: Four times a day (QID) | ORAL | Status: DC | PRN

## 2020-06-02 MED ORDER — GABAPENTIN 300 MG PO CAPS: 300.0000 mg | ORAL_CAPSULE | Freq: Every day | ORAL | Status: DC

## 2020-06-02 MED ORDER — SODIUM CHLORIDE 0.9 % IV SOLN: INTRAVENOUS | Status: DC

## 2020-06-02 MED ORDER — METHYLPREDNISOLONE SODIUM SUCC 125 MG IJ SOLR
125.0000 mg | Freq: Once | INTRAMUSCULAR | Status: AC
  Administered 2020-06-02: 125 mg via INTRAVENOUS
  Filled 2020-06-02: qty 2

## 2020-06-02 MED ORDER — CIPROFLOXACIN HCL 500 MG PO TABS: 500.0000 mg | ORAL_TABLET | Freq: Two times a day (BID) | ORAL | Status: DC

## 2020-06-02 MED ORDER — IPRATROPIUM-ALBUTEROL 0.5-2.5 (3) MG/3ML IN SOLN
3.0000 mL | Freq: Four times a day (QID) | RESPIRATORY_TRACT | Status: DC
  Administered 2020-06-02 – 2020-06-03 (×5): 3 mL via RESPIRATORY_TRACT
  Filled 2020-06-02 (×5): qty 3

## 2020-06-02 MED ORDER — ACETAMINOPHEN 325 MG PO TABS: 650.0000 mg | ORAL_TABLET | Freq: Four times a day (QID) | ORAL | Status: DC | PRN

## 2020-06-02 MED ORDER — BISACODYL 10 MG RE SUPP: 10.0000 mg | Freq: Every day | RECTAL | Status: DC | PRN

## 2020-06-02 MED ORDER — ALBUTEROL SULFATE (2.5 MG/3ML) 0.083% IN NEBU
5.0000 mg | INHALATION_SOLUTION | Freq: Once | RESPIRATORY_TRACT | Status: AC
  Administered 2020-06-02: 5 mg via RESPIRATORY_TRACT
  Filled 2020-06-02: qty 6

## 2020-06-02 MED ORDER — ONDANSETRON HCL 4 MG/2ML IJ SOLN: 4.0000 mg | Freq: Four times a day (QID) | INTRAMUSCULAR | Status: DC | PRN

## 2020-06-02 MED ORDER — GABAPENTIN 300 MG PO CAPS
300.0000 mg | ORAL_CAPSULE | Freq: Two times a day (BID) | ORAL | Status: DC
  Administered 2020-06-02 (×2): 300 mg via ORAL
  Filled 2020-06-02 (×3): qty 1

## 2020-06-02 MED ORDER — ALBUTEROL (5 MG/ML) CONTINUOUS INHALATION SOLN
10.0000 mg/h | INHALATION_SOLUTION | Freq: Once | RESPIRATORY_TRACT | Status: AC
  Administered 2020-06-02: 10 mg/h via RESPIRATORY_TRACT
  Filled 2020-06-02: qty 20

## 2020-06-02 MED ORDER — POLYETHYLENE GLYCOL 3350 17 G PO PACK
17.0000 g | PACK | Freq: Every day | ORAL | Status: DC | PRN
  Filled 2020-06-02: qty 1

## 2020-06-02 MED ORDER — IBUPROFEN 800 MG PO TABS
800.0000 mg | ORAL_TABLET | Freq: Three times a day (TID) | ORAL | Status: DC | PRN
  Administered 2020-06-02 – 2020-06-03 (×3): 800 mg via ORAL
  Filled 2020-06-02 (×3): qty 1

## 2020-06-02 NOTE — ED Triage Notes (Signed)
Pt came in with c/o SOB that started three hours ago. Pt has taken inhalers with no improvement. Hx of COPD. RA sats are 85%. Pt using accessory muscles to breathe

## 2020-06-02 NOTE — ED Notes (Signed)
Pt ambulatory with pulse oximetry on room air, patient's saturations between 84-87%

## 2020-06-02 NOTE — H&P (Signed)
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 This was accompanied by chest tightness, but no n/v or abdom pain. He was administered inhaled medical tx in the ED, as well as systemic steroid tx, but his wheezing persisted as did his hypoxic resp failure (sats in the upper 80s on RA).  At the time of my visit the patient is alert and oriented but does continue to wheeze.  He states his chest tightness resolved when he was placed on oxygen in the ED.  He denies headache nausea vomiting or abdominal pain.  He feels that his breathing is still not back to his baseline though it is improving.  Assessment/Plan  Acute bronchospastic COPD exacerbation Continue systemic steroid therapy and inhaled medications -monitor closely -wean oxygen as able  Acute hypoxic respiratory failure Felt to be simply due to COPD exacerbation -wean oxygen as able -we will need to consider the possibility the patient has progressed to the point that he now needs home oxygen -ambulatory O2 screening will be accomplished prior to discharge  Chronic cystic bronchiectasis  It appears the patient is prescribed thrice weekly antibiotic -we will continue this for now  Chest pressure Likely a consequence of his COPD exacerbation -we will check a screening twelve-lead EKG  Polycythemia  Likely an indication of persistent hypoxia -ambulatory and resting oxygen screening to be accomplished when patient has returned to his baseline respiratory status  Mild hyponatremia  Suspect this is simple volume  depletion, but there could be an element of SIADH given his history -monitor trend with gentle hydration and evaluate further as indicated  Hx of L Lung adenocarcinoma s/p resection 2019 Patient reports that he does not require ongoing therapy and that he has been declared cancer free  DVT prophylaxis: lovenox Code Status: FULL Family Communication: Spoke with the patient's significant other at bedside Disposition Plan: admit to acute unit  Consults called: none   Review of Systems: As per HPI otherwise 10 point review of systems negative.   Past Medical History:  Diagnosis Date  . COPD (chronic obstructive pulmonary disease) (HCC)     Past Surgical History:  Procedure Laterality Date  . LUNG SURGERY  2019    Family History  History reviewed. No pertinent family history.  Social History   reports that he has been smoking. He has been smoking about 0.50 packs per day. He has never used smokeless tobacco. He reports current alcohol use of about 3.0 standard drinks of alcohol per week. He reports that he does not use drugs.  Allergies No Known Allergies  Prior to Admission medications   Medication Sig Start Date End Date Taking? Authorizing Provider  albuterol (PROVENTIL HFA;VENTOLIN HFA) 108 (90 BASE) MCG/ACT inhaler Inhale 2 puffs into the lungs every 2 (two) hours as needed for wheezing or shortness of breath (cough). 02/07/14  Yes Street, [place], PA-C  azithromycin [facility]) 250 MG tablet Take 250 mg by mouth every Monday, Wednesday, and Friday. 10/06/19  Yes [provider]  fluticasone (FLONASE) 50 MCG/ACT nasal spray Place 2 sprays into both nostrils daily.  Patient taking differently: Place 2 sprays into both nostrils daily as needed for allergies. 12/22/18  Yes Fawze, Mina A, PA-C  gabapentin (NEURONTIN) 300 MG capsule Take 300-600 mg by mouth at bedtime.   Yes [provider]  ibuprofen (ADVIL,MOTRIN) 800 MG tablet Take 800 mg by mouth every 8 (eight)  hours as needed for moderate pain.   Yes [provider]  mometasone (ASMANEX) 220 MCG/INH inhaler Inhale 2 puffs into the lungs at bedtime. 09/06/19  Yes [provider]  Tiotropium Bromide-Olodaterol 2.5-2.5 MCG/ACT AERS Inhale 2 puffs into the lungs in the morning. 09/06/19  Yes [provider]    Physical Exam: Vitals:   06/02/20 0715 06/02/20 0723 06/02/20 0745 06/02/20 0800  BP: 138/89  117/86 122/79  Pulse: 99  92 93  Resp: 18  16 15   Temp:      TempSrc:      SpO2: 95% 94% 97% 97%  Height:        Constitutional: NAD, calm, comfortable Eyes: PERRL, lids and conjunctivae normal ENMT: Mucous membranes are moist.  Neck: normal, supple, no masses, no thyromegaly Respiratory: Diffuse expiratory wheezing without prolongation of expiratory phase, no focal crackles -not in extremis Cardiovascular: Regular rate and rhythm, no murmurs / rubs / gallops. No extremity edema. 2+ pedal pulses. No carotid bruits.  Abdomen: no tenderness, no masses palpated. No hepatosplenomegaly. Bowel sounds positive.  Musculoskeletal: no clubbing / cyanosis. No joint deformity upper and lower extremities. Good ROM, no contractures. Normal muscle tone.  Skin: no rashes, lesions, ulcers. No induration Neurologic: CN 2-12 grossly intact. Sensation intact, DTR normal. Strength 5/5 in all 4.  Psychiatric: Normal judgment and insight. Alert and oriented x 3. Normal mood.    Labs on Admission:   CBC: Recent Labs  Lab 06/02/20 0315  WBC 5.9  NEUTROABS 3.1  HGB 17.3*  HCT 50.8  MCV 91.9  PLT 422*   Basic Metabolic Panel: Recent Labs  Lab 06/02/20 0315  NA 133*  K 3.9  CL 97*  CO2 28  GLUCOSE 108*  BUN 11  CREATININE 1.01  CALCIUM 9.2   GFR: CrCl cannot be calculated (Unknown ideal weight.).   Liver Function Tests: Recent Labs  Lab 06/02/20 0315  AST 25  ALT 14  ALKPHOS 71  BILITOT 0.5  PROT 7.0  ALBUMIN 3.6    Radiological Exams on Admission: DG Chest  Port 1 View  Result Date: 06/02/2020 CLINICAL DATA:  Sudden onset chest pain and shortness of breath EXAM: PORTABLE CHEST 1 VIEW COMPARISON:  12/22/2018 FINDINGS: Cardiac shadow is within normal limits. The lungs are well aerated bilaterally. Some persistent and slightly more focal linear density is noted in the right lung base consistent with prior scarring. No acute infiltrate or sizable effusion is noted. Mild chronic scarring is noted in the left lung is well. IMPRESSION: Chronic scarring bilaterally without acute abnormality. Electronically Signed   By: 13/11/2018 M.D.   On: 06/02/2020 03:32    EKG: Not performed in the ED  06/04/2020, MD [facility] Office  [phone] Pager - Text Page per Amion as per below:  On-Call/Text Page:      [phone]  If 7PM-7AM, please contact night-coverage [email or web address] 06/02/2020, 8:07 AM

## 2020-06-02 NOTE — ED Notes (Signed)
RT called for continuous nebulizer treatment. 

## 2020-06-02 NOTE — ED Provider Notes (Signed)
[facility]-EMERGENCY DEPT Provider Note   CSN: [id number] Arrival date & time: 06/02/20  0256     History Chief Complaint  Patient presents with  . Shortness of Breath  . Chest Pain    Jesse Powell is a 55 y.o. male.  [facility] with [facility] of [facility], presents with onset of wheezing and SOB that started last evening (06/01/20). He used his inhaler multiple times over a 2 hour period without relief. He reports chest tightness and pain. No nausea, vomiting, diaphoresis, fever. He states he does not usually have discomfort in his chest with his wheezing episodes like tonight. He reports he is a former smoker, and does not use oxygen at home.  The history is provided by the patient. No language interpreter was used.       Past Medical History:  Diagnosis Date  . COPD (chronic obstructive pulmonary disease) (HCC)     There are no problems to display for this patient.   Past Surgical History:  Procedure Laterality Date  . LUNG SURGERY  2019       No family history on file.  Social History   Tobacco Use  . Smoking status: Current Every Day Smoker    Packs/day: 0.50  . Smokeless tobacco: Never Used  Substance Use Topics  . Alcohol use: Yes    Alcohol/week: 3.0 standard drinks    Types: 3 Cans of beer per week  . Drug use: No    Home Medications Prior to Admission medications   Medication Sig Start Date End Date Taking? Authorizing Provider  albuterol (PROVENTIL HFA;VENTOLIN HFA) 108 (90 BASE) MCG/ACT inhaler Inhale 2 puffs into the lungs every 2 (two) hours as needed for wheezing or shortness of breath (cough). 02/07/14  Yes Street, [place], PA-C  azithromycin [facility]) 250 MG tablet Take 250 mg by mouth every Monday, Wednesday, and Friday. 10/06/19  Yes [provider]  fluticasone (FLONASE) 50 MCG/ACT nasal spray Place 2 sprays into both nostrils daily. Patient taking differently: Place 2 sprays into both nostrils  daily as needed for allergies. 12/22/18  Yes Fawze, Mina A, PA-C  gabapentin (NEURONTIN) 300 MG capsule Take 300-600 mg by mouth at bedtime.   Yes [provider]  ibuprofen (ADVIL,MOTRIN) 800 MG tablet Take 800 mg by mouth every 8 (eight) hours as needed for moderate pain.   Yes [provider]  mometasone (ASMANEX) 220 MCG/INH inhaler Inhale 2 puffs into the lungs at bedtime. 09/06/19  Yes [provider]  Tiotropium Bromide-Olodaterol 2.5-2.5 MCG/ACT AERS Inhale 2 puffs into the lungs in the morning. 09/06/19  Yes [provider]    Allergies    Patient has no known allergies.  Review of Systems   Review of Systems  Constitutional: Negative for chills, diaphoresis and fever.  HENT: Negative.  Negative for congestion.   Respiratory: Positive for cough ("normal cough", no change), chest tightness, shortness of breath and wheezing.   Cardiovascular: Positive for chest pain. Negative for leg swelling.  Gastrointestinal: Negative.  Negative for abdominal pain and nausea.  Musculoskeletal: Negative.  Negative for myalgias.  Skin: Negative.   Neurological: Negative.  Negative for syncope, weakness, light-headedness and headaches.    Physical Exam Updated Vital Signs BP 124/80   Pulse 97   Temp 97.8 F (36.6 C) (Oral)   Resp 19   Ht 5\' 9"  (1.753 m)   SpO2 92%   BMI 23.63 kg/m   Physical Exam Vitals and nursing note reviewed.  Constitutional:      Appearance: He is well-developed.  HENT:     Head: Normocephalic.  Pulmonary:     Effort: Tachypnea present.     Comments: Coarse breath sounds throughout. There is extensive inspiratory and expiratory wheezing and prolonged expiration. He is retracting.  Abdominal:     Palpations: Abdomen is soft.     Tenderness: There is no abdominal tenderness.  Musculoskeletal:        General: Normal range of motion.     Right lower leg: No edema.     Left lower leg: No edema.  Skin:    General: Skin is warm  and dry.  Neurological:     General: No focal deficit present.     Mental Status: He is alert and oriented to person, place, and time.     ED Results / Procedures / Treatments   Labs (all labs ordered are listed, but only abnormal results are displayed) Labs Reviewed  CBC WITH DIFFERENTIAL/PLATELET - Abnormal; Notable for the following components:      Result Value   Hemoglobin 17.3 (*)    Platelets 422 (*)    All other components within normal limits  COMPREHENSIVE METABOLIC PANEL - Abnormal; Notable for the following components:   Sodium 133 (*)    Chloride 97 (*)    Glucose, Bld 108 (*)    All other components within normal limits  RESP PANEL BY RT-PCR (FLU A&B, COVID) ARPGX2  POC SARS CORONAVIRUS 2 AG -  ED  TROPONIN I (HIGH SENSITIVITY)  TROPONIN I (HIGH SENSITIVITY)   Results for orders placed or performed during the hospital encounter of 06/02/20  CBC with Differential  Result Value Ref Range   WBC 5.9 4.0 - 10.5 K/uL   RBC 5.53 4.22 - 5.81 MIL/uL   Hemoglobin 17.3 (H) 13.0 - 17.0 g/dL   HCT 27.0 62.3 - 76.2 %   MCV 91.9 80.0 - 100.0 fL   MCH 31.3 26.0 - 34.0 pg   MCHC 34.1 30.0 - 36.0 g/dL   RDW 83.1 51.7 - 61.6 %   Platelets 422 (H) 150 - 400 K/uL   nRBC 0.0 0.0 - 0.2 %   Neutrophils Relative % 52 %   Neutro Abs 3.1 1.7 - 7.7 K/uL   Lymphocytes Relative 29 %   Lymphs Abs 1.7 0.7 - 4.0 K/uL   Monocytes Relative 11 %   Monocytes Absolute 0.6 0.1 - 1.0 K/uL   Eosinophils Relative 7 %   Eosinophils Absolute 0.4 0.0 - 0.5 K/uL   Basophils Relative 1 %   Basophils Absolute 0.1 0.0 - 0.1 K/uL   Immature Granulocytes 0 %   Abs Immature Granulocytes 0.01 0.00 - 0.07 K/uL  Comprehensive metabolic panel  Result Value Ref Range   Sodium 133 (L) 135 - 145 mmol/L   Potassium 3.9 3.5 - 5.1 mmol/L   Chloride 97 (L) 98 - 111 mmol/L   CO2 28 22 - 32 mmol/L   Glucose, Bld 108 (H) 70 - 99 mg/dL   BUN 11 6 - 20 mg/dL   Creatinine, Ser 0.73 0.61 - 1.24 mg/dL   Calcium  9.2 8.9 - 71.0 mg/dL   Total Protein 7.0 6.5 - 8.1 g/dL   Albumin 3.6 3.5 - 5.0 g/dL   AST 25 15 - 41 U/L   ALT 14 0 - 44 U/L   Alkaline Phosphatase 71 38 - 126 U/L   Total Bilirubin 0.5 0.3 - 1.2 mg/dL   GFR,  Estimated >60 >60 mL/min   Anion gap 8 5 - 15  POC SARS Coronavirus 2 Ag-ED - Nasal Swab  Result Value Ref Range   SARSCOV2ONAVIRUS 2 AG NEGATIVE NEGATIVE  Troponin I (High Sensitivity)  Result Value Ref Range   Troponin I (High Sensitivity) 3 <18 ng/L    EKG EKG Interpretation  Date/Time:  Friday June 02 2020 03:02:46 EDT Ventricular Rate:  100 PR Interval:  131 QRS Duration: 91 QT Interval:  357 QTC Calculation: 461 R Axis:   94 Text Interpretation: Sinus tachycardia Right atrial enlargement Confirmed by Palumbo, April ([id number]) on 06/02/2020 4:31:37 AM   Radiology DG Chest Port 1 View  Result Date: 06/02/2020 CLINICAL DATA:  Sudden onset chest pain and shortness of breath EXAM: PORTABLE CHEST 1 VIEW COMPARISON:  12/22/2018 FINDINGS: Cardiac shadow is within normal limits. The lungs are well aerated bilaterally. Some persistent and slightly more focal linear density is noted in the right lung base consistent with prior scarring. No acute infiltrate or sizable effusion is noted. Mild chronic scarring is noted in the left lung is well. IMPRESSION: Chronic scarring bilaterally without acute abnormality. Electronically Signed   By: Alcide CleverMark  Lukens M.D.   On: 06/02/2020 03:32    Procedures Procedures  CRITICAL CARE Performed by: Arnoldo HookerShari A Keaun Schnabel   Total critical care time: 50 minutes  Critical care time was exclusive of separately billable procedures and treating other patients.  Critical care was necessary to treat or prevent imminent or life-threatening deterioration.  Critical care was time spent personally by me on the following activities: development of treatment plan with patient and/or surrogate as well as nursing, discussions with consultants, evaluation of  patient's response to treatment, examination of patient, obtaining history from patient or surrogate, ordering and performing treatments and interventions, ordering and review of laboratory studies, ordering and review of radiographic studies, pulse oximetry and re-evaluation of patient's condition.  Medications Ordered in ED Medications  albuterol (PROVENTIL,VENTOLIN) solution continuous neb (has no administration in time range)  albuterol (VENTOLIN HFA) 108 (90 Base) MCG/ACT inhaler 8 puff (8 puffs Inhalation Given 06/02/20 0328)  methylPREDNISolone sodium succinate (SOLU-MEDROL) 125 mg/2 mL injection 125 mg (125 mg Intravenous Given 06/02/20 0328)  albuterol (PROVENTIL) (2.5 MG/3ML) 0.083% nebulizer solution 5 mg (5 mg Nebulization Given 06/02/20 0418)  ipratropium (ATROVENT) nebulizer solution 0.5 mg (0.5 mg Nebulization Given 06/02/20 0418)  albuterol (PROVENTIL) (2.5 MG/3ML) 0.083% nebulizer solution 5 mg (5 mg Nebulization Given 06/02/20 0503)  ipratropium (ATROVENT) nebulizer solution 0.5 mg (0.5 mg Nebulization Given 06/02/20 0504)  albuterol (PROVENTIL) (2.5 MG/3ML) 0.083% nebulizer solution 5 mg (5 mg Nebulization Given 06/02/20 0556)  ipratropium (ATROVENT) nebulizer solution 0.5 mg (0.5 mg Nebulization Given 06/02/20 0556)    ED Course  I have reviewed the triage vital signs and the nursing notes.  Pertinent labs & imaging results that were available during my care of the patient were reviewed by me and considered in my medical decision making (see chart for details).    MDM Rules/Calculators/A&P                          Patient to ED with wheezing, SOB, h/o COPD. Not O2 dependent. No fever. C/O chest pain/tightness.  On arrival he is found to have hypoxia to 85% on room air. He is placed on 3L and saturations rebound to 95%. He is tachypneic, retracting, in mild respiratory distress. Albuterol inhaler 8 puffs provided while POC COVID pending.  POC COVID negative. He is immediately  started on back-to-back duo neb treatments. He is re-examined throughout treatment and reports feeling better, appears to be breathing easier, but rales and significant wheezing persist. IV solumedrol was provided.   After completion of the 3rd duo neb, his oxygen was turned off. After 2-3 minutes, his O2 sat drops to 89% while at rest, and 84% while ambulatory. Labs are essentially unremarkable. Troponin x 2 negative with non-ischemic EKG. Doubt chest pain is associated is ACS and favor severe COPD exacerbation.   Plan to admit for further treatment.   Final Clinical Impression(s) / ED Diagnoses Final diagnoses:  SOB (shortness of breath)   1. COPD exacerbation 2. Hypoxia  Rx / DC Orders ED Discharge Orders    None       Danne [place] 06/02/20 [id number]    Palumbo, April, MD 06/02/20 [phone]

## 2020-06-02 NOTE — Progress Notes (Signed)
   06/02/20 1801  Assess: MEWS Score  Temp 98 F (36.7 C)  BP 136/78  Pulse Rate (!) 113  Resp 20  Level of Consciousness Alert  SpO2 96 %  O2 Device Nasal Cannula  Assess: MEWS Score  MEWS Temp 0  MEWS Systolic 0  MEWS Pulse 2  MEWS RR 0  MEWS LOC 0  MEWS Score 2  MEWS Score Color Yellow  Assess: if the MEWS score is Yellow or Red  Were vital signs taken at a resting state? Yes  Focused Assessment No change from prior assessment  Early Detection of Sepsis Score *See Row Information* Low  MEWS guidelines implemented *See Row Information* Yes  Treat  MEWS Interventions Escalated (See documentation below)  Pain Scale 0-10  Pain Score 0  Take Vital Signs  Increase Vital Sign Frequency  Yellow: Q 2hr X 2 then Q 4hr X 2, if remains yellow, continue Q 4hrs  Escalate  MEWS: Escalate Yellow: discuss with charge nurse/RN and consider discussing with provider and RRT  Notify: Charge Nurse/RN  Name of Charge Nurse/RN Notified Kasia, RN  Date Charge Nurse/RN Notified 06/02/20  Time Charge Nurse/RN Notified 1808  Notify: Provider  Provider Name/Title McClung  Date Provider Notified 06/02/20  Time Provider Notified [phone]  Notification Type Page  Provider response No new orders  Document  Progress note created (see row info) Yes

## 2020-06-03 LAB — COMPREHENSIVE METABOLIC PANEL
ALT: 13 U/L (ref 0–44)
AST: 24 U/L (ref 15–41)
Albumin: 3.2 g/dL — ABNORMAL LOW (ref 3.5–5.0)
Alkaline Phosphatase: 58 U/L (ref 38–126)
Anion gap: 9 (ref 5–15)
BUN: 15 mg/dL (ref 6–20)
CO2: 26 mmol/L (ref 22–32)
Calcium: 9.2 mg/dL (ref 8.9–10.3)
Chloride: 100 mmol/L (ref 98–111)
Creatinine, Ser: 1.16 mg/dL (ref 0.61–1.24)
GFR, Estimated: >60 mL/min (ref >60)
Glucose, Bld: 120 mg/dL — ABNORMAL HIGH (ref 70–99)
Potassium: 5 mmol/L (ref 3.5–5.1)
Sodium: 135 mmol/L (ref 135–145)
Total Bilirubin: 0.4 mg/dL (ref 0.3–1.2)
Total Protein: 6.3 g/dL — ABNORMAL LOW (ref 6.5–8.1)

## 2020-06-03 LAB — CBC
HCT: 43.3 % (ref 39.0–52.0)
Hemoglobin: 14.7 g/dL (ref 13.0–17.0)
MCH: 31.3 pg (ref 26.0–34.0)
MCHC: 33.9 g/dL (ref 30.0–36.0)
MCV: 92.1 fL (ref 80.0–100.0)
Platelets: 383 10*3/uL (ref 150–400)
RBC: 4.7 MIL/uL (ref 4.22–5.81)
RDW: 13.5 % (ref 11.5–15.5)
WBC: 18.6 10*3/uL — ABNORMAL HIGH (ref 4.0–10.5)
nRBC: 0 % (ref 0.0–0.2)

## 2020-06-03 LAB — MAGNESIUM: Magnesium: 2 mg/dL (ref 1.7–2.4)

## 2020-06-03 LAB — HIV ANTIBODY (ROUTINE TESTING W REFLEX): HIV Screen 4th Generation wRfx: NONREACTIVE

## 2020-06-03 MED ORDER — GABAPENTIN 300 MG PO CAPS
300.0000 mg | ORAL_CAPSULE | ORAL | Status: DC
  Administered 2020-06-03 – 2020-06-04 (×2): 300 mg via ORAL
  Filled 2020-06-03 (×2): qty 1

## 2020-06-03 MED ORDER — NON FORMULARY: 440.0000 ug | Freq: Every day | Status: DC

## 2020-06-03 MED ORDER — NON FORMULARY: 5.0000 ug | Freq: Every morning | Status: DC

## 2020-06-03 MED ORDER — ALBUTEROL SULFATE HFA 108 (90 BASE) MCG/ACT IN AERS
2.0000 | INHALATION_SPRAY | RESPIRATORY_TRACT | Status: DC | PRN
  Filled 2020-06-03: qty 6.7

## 2020-06-03 MED ORDER — MOMETASONE FUROATE 220 MCG/INH IN AEPB
2.0000 | INHALATION_SPRAY | Freq: Every day | RESPIRATORY_TRACT | Status: DC
  Administered 2020-06-03: 2 via RESPIRATORY_TRACT

## 2020-06-03 MED ORDER — TIOTROPIUM BROMIDE-OLODATEROL 2.5-2.5 MCG/ACT IN AERS: 2.0000 | INHALATION_SPRAY | Freq: Every day | RESPIRATORY_TRACT | Status: DC

## 2020-06-03 NOTE — Progress Notes (Signed)
SATURATION QUALIFICATIONS: (This note is used to comply with regulatory documentation for home oxygen)  Patient Saturations on Room Air at Rest = 98%  Patient Saturations on Room Air while Ambulating = 92%  Patient Saturations on 0 Liters of oxygen while Ambulating = 92%  Please briefly explain why patient needs home oxygen: 

## 2020-06-03 NOTE — Progress Notes (Signed)
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 This was accompanied by chest tightness, but no n/v or abdom pain. He was administered inhaled medical tx in the ED, as well as systemic steroid tx, but his wheezing persisted as did his hypoxic resp failure (sats in the upper 80s on RA).   Consultants:  None  Code Status: FULL CODE  Antimicrobials:  Levaquin 4/22 >  DVT prophylaxis: Lovenox  Subjective: Afebrile.  Sinus tachycardia much improved/nearly resolved.  Saturations 94-96% on 2 L nasal cannula.  Patient feels he is much improved today.  States his breathing is rapidly approaching his baseline.  Denies chest pain nausea vomiting or abdominal pain.  Assessment & Plan:  Acute bronchospastic COPD exacerbation Rapidly taper systemic steroid therapy and transition back to long-acting inhaled medications - monitor closely - wean oxygen as able -assess ambulatory oxygen saturation  Acute hypoxic respiratory failure Felt to be simply due to COPD exacerbation -wean oxygen as able -we will need to consider the possibility the patient has progressed to the point that he now needs home oxygen -ambulatory O2 screening will be accomplished prior to discharge  Chronic cystic bronchiectasis  It appears the patient is prescribed thrice weekly antibiotic -we will resume this at the time of discharge  Chest pressure Likely a consequence of his COPD exacerbation - a screening twelve-lead EKG was without worrisome ischemic findings and the patient's symptoms have resolved with improvement in his respiratory  status  Polycythemia  Appears to have been primarily due to hemoconcentration with hemoglobin improved to normal range with simple volume resuscitation  Mild hyponatremia  Due to simple volume depletion -corrected with gentle volume resuscitation  Hx of L Lung adenocarcinoma s/p resection 2019 Patient reports that he does not require ongoing therapy and that he has been declared cancer free    Family Communication: Lengthy discussion with the patient, his mother, and his father at bedside Status is: Inpatient  Remains inpatient appropriate because:Inpatient level of care appropriate due to severity of illness   Dispo: The patient is from: Home              Anticipated d/c is to: Home              Patient currently is not medically stable to d/c.   Difficult to place patient No   Objective: Blood pressure 129/69, pulse (!) 104, temperature 98.5 F (36.9 C), temperature source Oral, resp. rate 18, height 5\' 9"  (1.753 m), weight 70 kg, SpO2 94 %.  Intake/Output Summary (Last 24 hours) at 06/03/2020 1024 Last data filed at 06/03/2020 0700 Gross per 24 hour  Intake 1199.38 ml  Output --  Net 1199.38 ml   Filed Weights   06/02/20 1410  Weight: 70 kg    Examination: General: No acute respiratory distress Lungs: Much improved air movement throughout all fields with no focal crackles and no wheezing Cardiovascular: Regular rate and rhythm without murmur gallop or rub normal S1 and S2 Abdomen: Nontender, nondistended, soft, bowel sounds positive, no rebound, no ascites, no appreciable mass Extremities: No significant cyanosis, clubbing, or edema bilateral lower extremities  CBC:  Recent Labs  Lab 06/02/20 0315 06/03/20 0541  WBC 5.9 18.6*  NEUTROABS 3.1  --   HGB 17.3* 14.7  HCT 50.8 43.3  MCV 91.9 92.1  PLT 422* 383   Basic Metabolic Panel: Recent Labs  Lab 06/02/20 0315 06/03/20 0541  NA 133* 135  K 3.9 5.0  CL 97* 100  CO2 28 26  GLUCOSE 108* 120*  BUN  11 15  CREATININE 1.01 1.16  CALCIUM 9.2 9.2  MG  --  2.0   GFR: Estimated Creatinine Clearance: 73.8 mL/min (by C-G formula based on SCr of 1.16 mg/dL).  Liver Function Tests: Recent Labs  Lab 06/02/20 0315 06/03/20 0541  AST 25 24  ALT 14 13  ALKPHOS 71 58  BILITOT 0.5 0.4  PROT 7.0 6.3*  ALBUMIN 3.6 3.2*    Recent Results (from the past 240 hour(s))  Resp Panel by RT-PCR (Flu A&B, Covid) Nasopharyngeal Swab     Status: None   Collection Time: 06/02/20  6:44 AM   Specimen: Nasopharyngeal Swab; Nasopharyngeal(NP) swabs in vial transport medium  Result Value Ref Range Status   SARS Coronavirus 2 by RT PCR NEGATIVE NEGATIVE Final    Comment: (NOTE) SARS-CoV-2 target nucleic acids are NOT DETECTED.  The SARS-CoV-2 RNA is generally detectable in upper respiratory specimens during the acute phase of infection. The lowest concentration of SARS-CoV-2 viral copies this assay can detect is 138 copies/mL. A negative result does not preclude SARS-Cov-2 infection and should not be used as the sole basis for treatment or other patient management decisions. A negative result may occur with  improper specimen collection/handling, submission of specimen other than nasopharyngeal swab, presence of viral mutation(s) within the areas targeted by this assay, and inadequate number of viral copies(<138 copies/mL). A negative result must be combined with clinical observations, patient history, and epidemiological information. The expected result is Negative.  Fact Sheet for Patients:  BloggerCourse.com  Fact Sheet for Healthcare Providers:  SeriousBroker.it  This test is no t yet approved or cleared by the [place] FDA and  has been authorized for detection and/or diagnosis of SARS-CoV-2 by FDA under an Emergency Use Authorization (EUA). This EUA will remain  in effect (meaning this test can be used) for the duration of the COVID-19  declaration under Section 564(b)(1) of the Act, 21 U.S.C.section 360bbb-3(b)(1), unless the authorization is terminated  or revoked sooner.       Influenza A by PCR NEGATIVE NEGATIVE Final   Influenza B by PCR NEGATIVE NEGATIVE Final    Comment: (NOTE) The Xpert Xpress SARS-CoV-2/FLU/RSV plus assay is intended as an aid in the diagnosis of influenza from Nasopharyngeal swab specimens and should not be used as a sole basis for treatment. Nasal washings and aspirates are unacceptable for Xpert Xpress SARS-CoV-2/FLU/RSV testing.  Fact Sheet for Patients: BloggerCourse.com  Fact Sheet for Healthcare Providers: SeriousBroker.it  This test is not yet approved or cleared by the [place] FDA and has been authorized for detection and/or diagnosis of SARS-CoV-2 by FDA under an Emergency Use Authorization (EUA). This EUA will remain in effect (meaning this test can be used) for the duration of the COVID-19 declaration under Section 564(b)(1) of the Act, 21 U.S.C. section 360bbb-3(b)(1), unless the authorization is terminated or revoked.  Performed at [facility], [address], [place], [place] [address]      Scheduled Meds: . enoxaparin (LOVENOX) injection  40 mg Subcutaneous Q24H  . gabapentin  300 mg Oral 2 times per  day  . guaiFENesin  600 mg Oral BID  . ipratropium-albuterol  3 mL Nebulization Q6H  . levofloxacin  500 mg Oral Daily  . multivitamin with minerals  1 tablet Oral Daily  . predniSONE  40 mg Oral Q breakfast  . rOPINIRole  0.25 mg Oral QHS  . senna  1 tablet Oral BID   Continuous Infusions: . sodium chloride 50 mL/hr at 06/02/20 1115     LOS: 1 day   Lonia Blood, MD [facility] Office  [phone] Pager - Text Page per Loretha Stapler  If 7PM-7AM, please contact night-coverage per Amion 06/03/2020, 10:24 AM

## 2020-06-04 MED ORDER — PREDNISONE 20 MG PO TABS: 40.0000 mg | ORAL_TABLET | Freq: Every day | ORAL | 0 refills | Status: AC

## 2020-06-04 MED ORDER — PREDNISONE 20 MG PO TABS: 40.0000 mg | ORAL_TABLET | Freq: Every day | ORAL | 0 refills | Status: DC

## 2020-06-04 NOTE — Discharge Summary (Signed)
DISCHARGE SUMMARY  Jesse Powell  MR#: [id number]  DOB:May 15, 1965  Date of Admission: 06/02/2020 Date of Discharge: 06/04/2020  Attending Physician:Juelz Whittenberg Silvestre Gunner, MD  Patient's ZMO:QHUTML, Jesse Powell  Consults: none  Disposition: D/C home    Follow-up Appts:  Follow-up Information    [facility] week(s).   Contact information: [address] [facility] [place] [address] [phone]               Discharge Diagnoses: Acute bronchospastic COPD exacerbation Acute hypoxic respiratory failure Chronic cystic bronchiectasis Chest pressure Polycythemia Mild hyponatremia Hx of L Lung adenocarcinoma s/p resection 2019  Initial presentation: 54yoMarinew/ a hx of tobacco abuse, L lung adenocarcinoma s/p LUL and LLL wedge resection 2019 [facility]), cystic bronchiectasis,andCOPD (not O2 dependent) who presented to the [facility] ED w/ ~24hrs of SOB and active wheezing that failed to improve despite the repeated use of his home inhaled medication therapy. This was accompanied by chest tightness, but no n/v or abdom pain. He was administered inhaled medical tx in the ED, as well as systemic steroid tx, but his wheezing persisted as did his hypoxic resp failure (sats in the upper 80s on RA).  Hospital Course:  Acute bronchospastic COPD exacerbation Rapidly tapered systemic steroid therapy and transitioned back to long-acting inhaled medications as pt rapidly improved - successfully weaned oxygen to RA w/ sats at 92% or > even w/ ambulation    Acute hypoxic respiratory failure Felt to be simply due to COPD exacerbation - successfully weaned oxygen to RA w/ sats at 92% or > even w/ ambulation   Chronic cystic bronchiectasis It appears the patient is prescribed thrice weekly antibiotic- resume this at the time of discharge  Chest pressure Likely a consequence of his COPD exacerbation- a screening twelve-lead EKG was without  worrisome ischemic findings and the patient's symptoms resolved with improvement in his respiratory status  Polycythemia Appears to have been primarily due to hemoconcentration with hemoglobin improved to normal range with simple volume resuscitation  Mild hyponatremia Due to simple volume depletion - corrected with gentle volume resuscitation  Hx of L Lung adenocarcinoma s/p resection 2019 Patient reports that he does not require ongoing therapy and that he has been declared cancer free   Allergies as of 06/04/2020   No Known Allergies     Medication List    TAKE these medications   albuterol 108 (90 Base) MCG/ACT inhaler Commonly known as: VENTOLIN HFA Inhale 2 puffs into the lungs every 2 (two) hours as needed for wheezing or shortness of breath (cough).   azithromycin 250 MG tablet Commonly known as: ZITHROMAX Take 250 mg by mouth every Monday, Wednesday, and Friday.   fluticasone 50 MCG/ACT nasal spray Commonly known as: FLONASE Place 2 sprays into both nostrils daily. What changed:   when to take this  reasons to take this   gabapentin 300 MG capsule Commonly known as: NEURONTIN Take 300 mg by mouth at bedtime.   ibuprofen 800 MG tablet Commonly known as: ADVIL Take 800 mg by mouth every 8 (eight) hours as needed for moderate pain.   mometasone 220 MCG/INH inhaler Commonly known as: ASMANEX Inhale 2 puffs into the lungs at bedtime.   predniSONE 20 MG tablet Commonly known as: DELTASONE Take 2 tablets (40 mg total) by mouth daily with breakfast for 2 days. Start taking on: June 05, 2020   Tiotropium Bromide-Olodaterol 2.5-2.5 MCG/ACT Aers Inhale 2 puffs into the lungs in the morning.  Day of Discharge BP (!) 136/94   Pulse 88   Temp 97.9 F (36.6 C) (Oral)   Resp 16   Ht 5\' 9"  (1.753 m)   Wt 70 kg   SpO2 99%   BMI 22.79 kg/m   Physical Exam: General: No acute respiratory distress Lungs: Clear to auscultation bilaterally without  wheezes Cardiovascular: Regular rate and rhythm without murmur Abdomen: Nontender, nondistended, soft, bowel sounds positive, no rebound, no ascites, no appreciable mass Extremities: No significant edema bilateral lower extremities  Basic Metabolic Panel: Recent Labs  Lab 06/02/20 0315 06/03/20 0541  NA 133* 135  K 3.9 5.0  CL 97* 100  CO2 28 26  GLUCOSE 108* 120*  BUN 11 15  CREATININE 1.01 1.16  CALCIUM 9.2 9.2  MG  --  2.0    Liver Function Tests: Recent Labs  Lab 06/02/20 0315 06/03/20 0541  AST 25 24  ALT 14 13  ALKPHOS 71 58  BILITOT 0.5 0.4  PROT 7.0 6.3*  ALBUMIN 3.6 3.2*    CBC: Recent Labs  Lab 06/02/20 0315 06/03/20 0541  WBC 5.9 18.6*  NEUTROABS 3.1  --   HGB 17.3* 14.7  HCT 50.8 43.3  MCV 91.9 92.1  PLT 422* 383    Recent Results (from the past 240 hour(s))  Resp Panel by RT-PCR (Flu A&B, Covid) Nasopharyngeal Swab     Status: None   Collection Time: 06/02/20  6:44 AM   Specimen: Nasopharyngeal Swab; Nasopharyngeal(NP) swabs in vial transport medium  Result Value Ref Range Status   SARS Coronavirus 2 by RT PCR NEGATIVE NEGATIVE Final    Comment: (NOTE) SARS-CoV-2 target nucleic acids are NOT DETECTED.  The SARS-CoV-2 RNA is generally detectable in upper respiratory specimens during the acute phase of infection. The lowest concentration of SARS-CoV-2 viral copies this assay can detect is 138 copies/mL. A negative result does not preclude SARS-Cov-2 infection and should not be used as the sole basis for treatment or other patient management decisions. A negative result may occur with  improper specimen collection/handling, submission of specimen other than nasopharyngeal swab, presence of viral mutation(s) within the areas targeted by this assay, and inadequate number of viral copies(<138 copies/mL). A negative result must be combined with clinical observations, patient history, and epidemiological information. The expected result is  Negative.  Fact Sheet for Patients:  06/04/20  Fact Sheet for Healthcare Providers:  BloggerCourse.com  This test is no t yet approved or cleared by the [place] FDA and  has been authorized for detection and/or diagnosis of SARS-CoV-2 by FDA under an Emergency Use Authorization (EUA). This EUA will remain  in effect (meaning this test can be used) for the duration of the COVID-19 declaration under Section 564(b)(1) of the Act, 21 U.S.C.section 360bbb-3(b)(1), unless the authorization is terminated  or revoked sooner.       Influenza A by PCR NEGATIVE NEGATIVE Final   Influenza B by PCR NEGATIVE NEGATIVE Final    Comment: (NOTE) The Xpert Xpress SARS-CoV-2/FLU/RSV plus assay is intended as an aid in the diagnosis of influenza from Nasopharyngeal swab specimens and should not be used as a sole basis for treatment. Nasal washings and aspirates are unacceptable for Xpert Xpress SARS-CoV-2/FLU/RSV testing.  Fact Sheet for Patients: [place]  Fact Sheet for Healthcare Providers: BloggerCourse.com  This test is not yet approved or cleared by the [place]ious[place]ker.[place] FDA and has been authorized for detection and/or diagnosis of SARS-CoV-2 by FDA under an Emergency Use Authorization (EUA). This  EUA will remain in effect (meaning this test can be used) for the duration of the COVID-19 declaration under Section 564(b)(1) of the Act, 21 U.S.C. section 360bbb-3(b)(1), unless the authorization is terminated or revoked.  Performed at [facility], [address], [place], [place] [address]       Time spent in discharge (includes decision making & examination of pt): 35 minutes  06/04/2020, 10:39 AM   Lonia Blood, MD [facility] Office  [phone]

## 2020-06-04 NOTE — Progress Notes (Signed)
Pt leaving at this time with his sister. Alert, oriented, and without c/o. Discharge instructions given/explained with pt verbalizing understanding. Pt aware to followup with [facility]. Pt left with Prednisone printed prescription.

## 2020-06-04 NOTE — Discharge Instructions (Signed)
Chronic Obstructive Pulmonary Disease Exacerbation  Chronic obstructive pulmonary disease (COPD) is a long-term (chronic) condition that affects the lungs. COPD is a general term that can be used to describe many different lung problems that cause lung swelling (inflammation) and limit airflow, including chronic bronchitis and emphysema. COPD exacerbations are episodes when breathing symptoms become much worse and require extra treatment. COPD exacerbations are usually caused by infections. Without treatment, COPD exacerbations can be severe and even life threatening. Frequent COPD exacerbations can cause further damage to the lungs. What are the causes? This condition may be caused by:  Respiratory infections, including viral and bacterial infections.  Exposure to smoke.  Exposure to air pollution, chemical fumes, or dust.  Things that give you an allergic reaction (allergens).  Not taking your usual COPD medicines as directed.  Underlying medical problems, such as congestive heart failure or infections not involving the lungs. In many cases, the cause (trigger) of this condition is not known. What increases the risk? The following factors may make you more likely to develop this condition:  Smoking cigarettes.  Old age.  Frequent prior COPD exacerbations. What are the signs or symptoms? Symptoms of this condition include:  Increased coughing.  Increased production of mucus from your lungs (sputum).  Increased wheezing.  Increased shortness of breath.  Rapid or labored breathing.  Chest tightness.  Less energy than usual.  Sleep disruption from symptoms.  Confusion or increased sleepiness. Often these symptoms happen or get worse even with the use of medicines. How is this diagnosed? This condition is diagnosed based on:  Your medical history.  A physical exam. You may also have tests, including:  A chest X-ray.  Blood tests.  Lung (pulmonary) function  tests. How is this treated? Treatment for this condition depends on the severity and cause of the symptoms. You may need to be admitted to a hospital for treatment. Some of the treatments commonly used to treat COPD exacerbations are:  Antibiotic medicines. These may be used for severe exacerbations caused by a lung infection, such as pneumonia.  Bronchodilators. These are inhaled medicines that expand the air passages and allow increased airflow.  Steroid medicines. These act to reduce inflammation in the airways. They may be given with an inhaler, taken by mouth, or given through an IV tube inserted into one of your veins.  Supplemental oxygen therapy.  Airway clearing techniques, such as noninvasive ventilation (NIV) and positive expiratory pressure (PEP). These provide respiratory support through a mask or other noninvasive device. An example of this would be using a continuous positive airway pressure (CPAP) machine to improve delivery of oxygen into your lungs. Follow these instructions at home: Medicines  Take over-the-counter and prescription medicines only as told by your health care provider. It is important to use correct technique with inhaled medicines.  If you were prescribed an antibiotic medicine or oral steroid, take it as told by your health care provider. Do not stop taking the medicine even if you start to feel better. Lifestyle  Eat a healthy diet.  Exercise regularly.  Get plenty of sleep.  Avoid exposure to all substances that irritate the airway, especially to tobacco smoke.  Wash your hands often with soap and water to reduce the risk of infection. If soap and water are not available, use hand sanitizer.  During flu season, avoid enclosed spaces that are crowded with people. General instructions  Drink enough fluid to keep your urine clear or pale yellow (unless you have a medical   condition that requires fluid restriction).  Use a cool mist vaporizer. This  humidifies the air and makes it easier for you to clear your chest when you cough.  If you have a home nebulizer and oxygen, continue to use them as told by your health care provider.  Keep all follow-up visits as told by your health care provider. This is important. How is this prevented?  Stay up-to-date on pneumococcal and influenza (flu) vaccines. A flu shot is recommended every year to help prevent exacerbations.  Do not use any products that contain nicotine or tobacco, such as cigarettes and e-cigarettes. Quitting smoking is very important in preventing COPD from getting worse and in preventing exacerbations from happening as often. If you need help quitting, ask your health care provider.  Follow all instructions for pulmonary rehabilitation after a recent exacerbation. This can help prevent future exacerbations.  Work with your health care provider to develop and follow an action plan. This tells you what steps to take when you experience certain symptoms. Contact a health care provider if:  You have a worsening of your regular COPD symptoms. Get help right away if:  You have worsening shortness of breath, even when resting.  You have trouble talking.  You have severe chest pain.  You cough up blood.  You have a fever.  You have weakness, vomit repeatedly, or faint.  You feel confused.  You are not able to sleep because of your symptoms.  You have trouble doing daily activities. Summary  COPD exacerbations are episodes when breathing symptoms become much worse and require extra treatment above your normal treatment.  Exacerbations can be severe and even life threatening. Frequent COPD exacerbations can cause further damage to your lungs.  COPD exacerbations are usually triggered by infections such as the flu, colds, and even pneumonia.  Treatment for this condition depends on the severity and cause of the symptoms. You may need to be admitted to a hospital for  treatment.  Quitting smoking is very important to prevent COPD from getting worse and to prevent exacerbations from happening as often. This information is not intended to replace advice given to you by your health care provider. Make sure you discuss any questions you have with your health care provider. Document Revised: 01/10/2017 Document Reviewed: 03/04/2016 Elsevier Patient Education  2021 [employer] Inc.  

## 2020-10-30 ENCOUNTER — Emergency Department (HOSPITAL_COMMUNITY)
Admission: EM | Admit: 2020-10-30 | Discharge: 2020-10-30 | Disposition: A | Discharge status: Home / Self Care | Payer: No Typology Code available for payment source | Attending: Emergency Medicine | Admitting: Emergency Medicine

## 2020-10-30 ENCOUNTER — Emergency Department (HOSPITAL_COMMUNITY): Payer: No Typology Code available for payment source

## 2020-10-30 ENCOUNTER — Encounter (HOSPITAL_COMMUNITY): Payer: Self-pay

## 2020-10-30 DIAGNOSIS — F17210 Nicotine dependence, cigarettes, uncomplicated: Secondary | ICD-10-CM | POA: Insufficient documentation

## 2020-10-30 DIAGNOSIS — J441 Chronic obstructive pulmonary disease with (acute) exacerbation: Secondary | ICD-10-CM | POA: Diagnosis not present

## 2020-10-30 DIAGNOSIS — R0789 Other chest pain: Secondary | ICD-10-CM | POA: Diagnosis present

## 2020-10-30 LAB — BASIC METABOLIC PANEL
Anion gap: 10 (ref 5–15)
BUN: 18 mg/dL (ref 6–20)
CO2: 28 mmol/L (ref 22–32)
Calcium: 9.8 mg/dL (ref 8.9–10.3)
Chloride: 94 mmol/L — ABNORMAL LOW (ref 98–111)
Creatinine, Ser: 0.9 mg/dL (ref 0.61–1.24)
GFR, Estimated: >60 mL/min (ref >60)
Glucose, Bld: 82 mg/dL (ref 70–99)
Potassium: 4.2 mmol/L (ref 3.5–5.1)
Sodium: 132 mmol/L — ABNORMAL LOW (ref 135–145)

## 2020-10-30 LAB — CBC
HCT: 47.1 % (ref 39.0–52.0)
Hemoglobin: 16.1 g/dL (ref 13.0–17.0)
MCH: 31 pg (ref 26.0–34.0)
MCHC: 34.2 g/dL (ref 30.0–36.0)
MCV: 90.8 fL (ref 80.0–100.0)
Platelets: 397 10*3/uL (ref 150–400)
RBC: 5.19 MIL/uL (ref 4.22–5.81)
RDW: 13.1 % (ref 11.5–15.5)
WBC: 4.2 10*3/uL (ref 4.0–10.5)
nRBC: 0 % (ref 0.0–0.2)

## 2020-10-30 LAB — TROPONIN I (HIGH SENSITIVITY)
Troponin I (High Sensitivity): 5 ng/L (ref <18)
Troponin I (High Sensitivity): 4 ng/L (ref <18)

## 2020-10-30 MED ORDER — METHYLPREDNISOLONE SODIUM SUCC 125 MG IJ SOLR
125.0000 mg | Freq: Once | INTRAMUSCULAR | Status: AC
  Administered 2020-10-30: 125 mg via INTRAVENOUS
  Filled 2020-10-30: qty 2

## 2020-10-30 MED ORDER — IPRATROPIUM-ALBUTEROL 0.5-2.5 (3) MG/3ML IN SOLN
3.0000 mL | Freq: Once | RESPIRATORY_TRACT | Status: AC
  Administered 2020-10-30: 3 mL via RESPIRATORY_TRACT
  Filled 2020-10-30: qty 3

## 2020-10-30 MED ORDER — PREDNISONE 50 MG PO TABS: 50.0000 mg | ORAL_TABLET | Freq: Every day | ORAL | 0 refills | Status: AC

## 2020-10-30 NOTE — Discharge Instructions (Signed)
You are seen in the ER today for your COPD flare.  Your physical exam, vital signs, blood work, and chest x-ray are very reassuring.  You were given a dose of steroids and breathing treatment in the ER.  You state you are feeling well.  You have been given a prescription for steroids for the next 5 days.  Please take them as prescribed for the entire course and follow-up with your lung doctor.  Return to the ER if any new chest pain, difficulty breathing, nausea, does not stop, or any other new severe symptom.

## 2020-10-30 NOTE — ED Triage Notes (Signed)
Pt presents with c/o shortness of breath and chest pain. Pt reports he has been using his rescue inhaler too often. Pt reports the chest pain has been present all night. Pt has a hx of COPD.

## 2020-10-30 NOTE — ED Provider Notes (Signed)
[facility]-EMERGENCY DEPT Provider Note   CSN: [id number] Arrival date & time: 10/30/20  1007     History Chief Complaint  Patient presents with   Chest Pain   Shortness of Breath    Jesse Powell is a 55 y.o. male  with history of COPD on Asmanex 2 Tropium, and as needed albuterol who presents with concern for sensation of being "jittery" and tightness in his chest this morning.  States that he took 2 breathing treatments and used his asthma inhaler with 2 puffs x 18 episodes yesterday.  States that he was moving furniture in his home and was short of breath all day.  Feels that he may have overdosed on albuterol.  I personally read this patient's medical records.  He has history of COPD as above.  Otherwise carries no medical diagnoses.  HPI     Past Medical History:  Diagnosis Date   COPD (chronic obstructive pulmonary disease) (HCC)     There are no problems to display for this patient.   Past Surgical History:  Procedure Laterality Date   LUNG SURGERY  2019       History reviewed. No pertinent family history.  Social History   Tobacco Use   Smoking status: Every Day    Packs/day: 0.50    Types: Cigarettes   Smokeless tobacco: Never  Substance Use Topics   Alcohol use: Yes    Alcohol/week: 3.0 standard drinks    Types: 3 Cans of beer per week   Drug use: No    Home Medications Prior to Admission medications   Medication Sig Start Date End Date Taking? Authorizing Provider  albuterol (PROVENTIL HFA;VENTOLIN HFA) 108 (90 BASE) MCG/ACT inhaler Inhale 2 puffs into the lungs every 2 (two) hours as needed for wheezing or shortness of breath (cough). 02/07/14  Yes Street, [place], PA-C  azithromycin [facility]) 250 MG tablet Take 250 mg by mouth every Monday, Wednesday, and Friday. 10/06/19  Yes [provider]  capsaicin (ZOSTRIX) 0.025 % cream Apply 1 application topically 2 (two) times daily as needed for pain. 10/04/20  Yes  [provider]  cyclobenzaprine (FLEXERIL) 10 MG tablet Take 1 tablet by mouth at bedtime as needed for muscle spasms. 08/08/20  Yes [provider]  fluticasone (FLONASE) 50 MCG/ACT nasal spray Place 2 sprays into both nostrils daily. Patient taking differently: Place 2 sprays into both nostrils daily as needed for allergies. 12/22/18  Yes Fawze, Mina A, PA-C  gabapentin (NEURONTIN) 300 MG capsule Take 300 mg by mouth at bedtime.   Yes [provider]  ibuprofen (ADVIL,MOTRIN) 800 MG tablet Take 800 mg by mouth every 8 (eight) hours as needed for moderate pain.   Yes [provider]  mometasone (ASMANEX) 220 MCG/INH inhaler Inhale 2 puffs into the lungs at bedtime. 09/06/19  Yes [provider]  predniSONE (DELTASONE) 50 MG tablet Take 1 tablet (50 mg total) by mouth daily with breakfast for 5 days. 10/30/20 11/04/20 Yes Aleigha Gilani, Lupe Carney R, PA-C  Tiotropium Bromide-Olodaterol 2.5-2.5 MCG/ACT AERS Inhale 2 puffs into the lungs in the morning. 09/06/19  Yes [provider]    Allergies    Patient has no known allergies.  Review of Systems   Review of Systems  Constitutional: Negative.   HENT: Negative.    Respiratory:  Positive for chest tightness and shortness of breath. Negative for cough.   Cardiovascular: Negative.   Gastrointestinal: Negative.   Genitourinary: Negative.   Musculoskeletal: Negative.  Neurological: Negative.    Physical Exam Updated Vital Signs BP 119/88   Pulse 96   Temp 97.6 F (36.4 C) (Oral)   Resp 18   Ht 5\' 9"  (1.753 m)   Wt 68.5 kg   SpO2 95%   BMI 22.30 kg/m   Physical Exam Vitals and nursing note reviewed.  Constitutional:      Appearance: He is normal weight. He is not ill-appearing or toxic-appearing.  HENT:     Head: Normocephalic and atraumatic.     Nose: Nose normal.     Mouth/Throat:     Mouth: Mucous membranes are moist.     Pharynx: Oropharynx is clear. Uvula midline. No  oropharyngeal exudate or posterior oropharyngeal erythema.     Tonsils: No tonsillar exudate.  Eyes:     General: Lids are normal. Vision grossly intact.        Right eye: No discharge.        Left eye: No discharge.     Extraocular Movements: Extraocular movements intact.     Conjunctiva/sclera: Conjunctivae normal.     Pupils: Pupils are equal, round, and reactive to light.  Neck:     Trachea: Trachea and phonation normal.  Cardiovascular:     Rate and Rhythm: Normal rate and regular rhythm.     Pulses: Normal pulses.     Heart sounds: Normal heart sounds. No murmur heard. Pulmonary:     Effort: Pulmonary effort is normal. Prolonged expiration present. No tachypnea, bradypnea, accessory muscle usage, respiratory distress or retractions.     Breath sounds: Examination of the right-middle field reveals wheezing. Examination of the left-middle field reveals wheezing. Examination of the right-lower field reveals wheezing. Examination of the left-lower field reveals wheezing. Wheezing present. No rales.  Chest:     Chest wall: No mass, lacerations, deformity, swelling, tenderness, crepitus or edema.  Abdominal:     General: Bowel sounds are normal. There is no distension.     Palpations: Abdomen is soft.     Tenderness: There is no abdominal tenderness. There is no right CVA tenderness, left CVA tenderness, guarding or rebound.  Musculoskeletal:        General: No deformity.     Cervical back: Normal range of motion and neck supple. No edema, rigidity or crepitus. No pain with movement, spinous process tenderness or muscular tenderness.     Right lower leg: No tenderness. No edema.     Left lower leg: No tenderness. No edema.  Lymphadenopathy:     Cervical: No cervical adenopathy.  Skin:    General: Skin is warm and dry.     Capillary Refill: Capillary refill takes less than 2 seconds.  Neurological:     Mental Status: He is alert. Mental status is at baseline.     GCS: GCS eye  subscore is 4. GCS verbal subscore is 5. GCS motor subscore is 6.     Sensory: Sensation is intact.     Motor: Motor function is intact.     Gait: Gait is intact.  Psychiatric:        Mood and Affect: Mood normal.    ED Results / Procedures / Treatments   Labs (all labs ordered are listed, but only abnormal results are displayed) Labs Reviewed  BASIC METABOLIC PANEL - Abnormal; Notable for the following components:      Result Value   Sodium 132 (*)    Chloride 94 (*)    All other components within normal limits  CBC  TROPONIN I (HIGH SENSITIVITY)  TROPONIN I (HIGH SENSITIVITY)    EKG None  Radiology DG Chest 2 View  Result Date: 10/30/2020 CLINICAL DATA:  Chest pain and shortness of breath. EXAM: CHEST - 2 VIEW COMPARISON:  06/02/2020 FINDINGS: Heart size is normal. No pleural effusion identified. Advanced changes of COPD/emphysema identified. Postsurgical changes are identified within the left upper lobe with several suture chains as well as areas of scarring and volume loss.No pleural effusion or edema. No airspace consolidation. IMPRESSION: Advanced changes of COPD/emphysema.  No acute findings. Electronically Signed   By: Signa Kell M.D.   On: 10/30/2020 11:32    Procedures Procedures   Medications Ordered in ED Medications  ipratropium-albuterol (DUONEB) 0.5-2.5 (3) MG/3ML nebulizer solution 3 mL (3 mLs Nebulization Given 10/30/20 1451)  methylPREDNISolone sodium succinate (SOLU-MEDROL) 125 mg/2 mL injection 125 mg (125 mg Intravenous Given 10/30/20 1451)    ED Course  I have reviewed the triage vital signs and the nursing notes.  Pertinent labs & imaging results that were available during my care of the patient were reviewed by me and considered in my medical decision making (see chart for details).    MDM Rules/Calculators/A&P                         55 year old male with COPD who presents with concern for SOB.   Ddx includes but is not limited to COPD  exacerbation, CHF, dysrhythmia, ACS, pleural effusion, PE, pneumothorax.  Hypertensive and tachycardic on intake as well as tachypneic.  At time of my exam patient is no longer tachycardic or tachypneic, does have wheezing throughout the lung fields bilaterally.  Abdominal exam is benign.  Patient is neurovascular tact in all 4 extremities and states that he is feeling back to his baseline at this time.  CBC unremarkable, BMP unremarkable, troponin normal, 574.  Chest x-ray negative for acute cardiopulmonary disease but did reveal advanced changes of COPD.  Will administer DuoNeb and IV Solu-Medrol.  Patient reevaluated with improvement in his symptoms, states he feels well to be discharged home.    Will discharge with oral prednisone burst.  No further work-up warranted in ED as time.  Marcy Salvo voiced understanding with medical evaluation and treatment plan.  She was questions answered to his expressed inspection.  Return precautions given.  Patient is well-appearing, stable, and appropriate for discharge at this time.  This chart was dictated using voice recognition software, Dragon. Despite the best efforts of this provider to proofread and correct errors, errors may still occur which can change documentation meaning.   Final Clinical Impression(s) / ED Diagnoses Final diagnoses:  COPD exacerbation (HCC)    Rx / DC Orders ED Discharge Orders          Ordered    predniSONE (DELTASONE) 50 MG tablet  Daily with breakfast        10/30/20 1519             Denzell Colasanti, Eugene Gavia, PA-C 10/30/20 1626    Mancel Bale, MD 10/30/20 1730

## 2020-12-28 ENCOUNTER — Emergency Department (HOSPITAL_COMMUNITY)
Admission: EM | Admit: 2020-12-28 | Discharge: 2020-12-28 | Disposition: A | Discharge status: Home / Self Care | Payer: No Typology Code available for payment source | Attending: Emergency Medicine | Admitting: Emergency Medicine

## 2020-12-28 ENCOUNTER — Other Ambulatory Visit: Payer: Self-pay

## 2020-12-28 ENCOUNTER — Emergency Department (HOSPITAL_COMMUNITY): Payer: No Typology Code available for payment source

## 2020-12-28 ENCOUNTER — Encounter (HOSPITAL_COMMUNITY): Payer: Self-pay

## 2020-12-28 DIAGNOSIS — F17210 Nicotine dependence, cigarettes, uncomplicated: Secondary | ICD-10-CM | POA: Insufficient documentation

## 2020-12-28 DIAGNOSIS — J180 Bronchopneumonia, unspecified organism: Secondary | ICD-10-CM | POA: Insufficient documentation

## 2020-12-28 DIAGNOSIS — J441 Chronic obstructive pulmonary disease with (acute) exacerbation: Secondary | ICD-10-CM | POA: Insufficient documentation

## 2020-12-28 DIAGNOSIS — Z20822 Contact with and (suspected) exposure to covid-19: Secondary | ICD-10-CM | POA: Insufficient documentation

## 2020-12-28 DIAGNOSIS — R0602 Shortness of breath: Secondary | ICD-10-CM | POA: Diagnosis present

## 2020-12-28 LAB — CBC WITH DIFFERENTIAL/PLATELET
Abs Immature Granulocytes: 0.04 10*3/uL (ref 0.00–0.07)
Basophils Absolute: 0.1 10*3/uL (ref 0.0–0.1)
Basophils Relative: 1 %
Eosinophils Absolute: 0.2 10*3/uL (ref 0.0–0.5)
Eosinophils Relative: 2 %
HCT: 47.2 % (ref 39.0–52.0)
Hemoglobin: 15.9 g/dL (ref 13.0–17.0)
Immature Granulocytes: 0 %
Lymphocytes Relative: 13 %
Lymphs Abs: 1.3 10*3/uL (ref 0.7–4.0)
MCH: 30.9 pg (ref 26.0–34.0)
MCHC: 33.7 g/dL (ref 30.0–36.0)
MCV: 91.8 fL (ref 80.0–100.0)
Monocytes Absolute: 1 10*3/uL (ref 0.1–1.0)
Monocytes Relative: 10 %
Neutro Abs: 7.3 10*3/uL (ref 1.7–7.7)
Neutrophils Relative %: 74 %
Platelets: 513 10*3/uL — ABNORMAL HIGH (ref 150–400)
RBC: 5.14 MIL/uL (ref 4.22–5.81)
RDW: 13.3 % (ref 11.5–15.5)
WBC: 9.8 10*3/uL (ref 4.0–10.5)
nRBC: 0 % (ref 0.0–0.2)

## 2020-12-28 LAB — BASIC METABOLIC PANEL
Anion gap: 10 (ref 5–15)
BUN: 16 mg/dL (ref 6–20)
CO2: 27 mmol/L (ref 22–32)
Calcium: 9.7 mg/dL (ref 8.9–10.3)
Chloride: 96 mmol/L — ABNORMAL LOW (ref 98–111)
Creatinine, Ser: 0.99 mg/dL (ref 0.61–1.24)
GFR, Estimated: >60 mL/min (ref >60)
Glucose, Bld: 113 mg/dL — ABNORMAL HIGH (ref 70–99)
Potassium: 5 mmol/L (ref 3.5–5.1)
Sodium: 133 mmol/L — ABNORMAL LOW (ref 135–145)

## 2020-12-28 LAB — RESP PANEL BY RT-PCR (FLU A&B, COVID) ARPGX2
Influenza A by PCR: NEGATIVE
Influenza B by PCR: NEGATIVE
SARS Coronavirus 2 by RT PCR: NEGATIVE

## 2020-12-28 MED ORDER — ALBUTEROL SULFATE HFA 108 (90 BASE) MCG/ACT IN AERS
2.0000 | INHALATION_SPRAY | Freq: Once | RESPIRATORY_TRACT | Status: AC
  Administered 2020-12-28: 17:00:00 2 via RESPIRATORY_TRACT
  Filled 2020-12-28: qty 6.7

## 2020-12-28 MED ORDER — PREDNISONE 20 MG PO TABS: 60.0000 mg | ORAL_TABLET | Freq: Every day | ORAL | 0 refills | Status: AC

## 2020-12-28 MED ORDER — IPRATROPIUM-ALBUTEROL 0.5-2.5 (3) MG/3ML IN SOLN
3.0000 mL | Freq: Once | RESPIRATORY_TRACT | Status: AC
  Administered 2020-12-28: 17:00:00 3 mL via RESPIRATORY_TRACT
  Filled 2020-12-28: qty 3

## 2020-12-28 MED ORDER — AMOXICILLIN-POT CLAVULANATE 875-125 MG PO TABS
1.0000 | ORAL_TABLET | Freq: Once | ORAL | Status: AC
  Administered 2020-12-28: 17:00:00 1 via ORAL
  Filled 2020-12-28: qty 1

## 2020-12-28 MED ORDER — IPRATROPIUM-ALBUTEROL 0.5-2.5 (3) MG/3ML IN SOLN
3.0000 mL | Freq: Once | RESPIRATORY_TRACT | Status: AC
  Administered 2020-12-28: 16:00:00 3 mL via RESPIRATORY_TRACT
  Filled 2020-12-28: qty 3

## 2020-12-28 MED ORDER — LEVOFLOXACIN 750 MG PO TABS: 750.0000 mg | ORAL_TABLET | Freq: Every day | ORAL | 0 refills | Status: AC

## 2020-12-28 MED ORDER — METHYLPREDNISOLONE SODIUM SUCC 125 MG IJ SOLR
125.0000 mg | Freq: Once | INTRAMUSCULAR | Status: AC
  Administered 2020-12-28: 16:00:00 125 mg via INTRAVENOUS
  Filled 2020-12-28: qty 2

## 2020-12-28 NOTE — Discharge Instructions (Addendum)
Take levaquin for the next 5 days to treat pneumonia.  To help with COPD exacerbation take prednisone for the next 5 days, use you albuterol inhaler every 6 hours for the next 24 hours and then as needed.  Please follow up with your regular doctor. Return for any worsening symptoms.

## 2020-12-28 NOTE — ED Provider Notes (Signed)
[facility]-EMERGENCY DEPT Provider Note   CSN: [id number] Arrival date & time: 12/28/20  1401     History Chief Complaint  Patient presents with   Shortness of Breath   Cough    Jesse Powell is a 55 y.o. male.  Jesse Powell is a 55 y.o. male with a history of COPD and bronchiectasis, who presents to the ED for evaluation of cough and shortness of breath.  Patient reports that he has been dealing with a worsening productive cough for the past week, this is worse at night.  With it he has had worsening shortness of breath and increased work of breathing.  He has had to use his inhaler very frequently and feels like he is only getting very brief relief from this.  Reports some associated chest wall pain from coughing but no persistent chest pain.  Also reports some abdominal soreness with cough.  No nausea or vomiting.  No fevers or chills.  No known sick contacts.  He is already on preventative therapy with Zithromax 3 days a week to prevent worsening of his bronchiectasis.  He is followed by the [place].  No other aggravating or alleviating factors.  The history is provided by the patient and medical records.      Past Medical History:  Diagnosis Date   COPD (chronic obstructive pulmonary disease) (HCC)     There are no problems to display for this patient.   Past Surgical History:  Procedure Laterality Date   LUNG SURGERY  2019       History reviewed. No pertinent family history.  Social History   Tobacco Use   Smoking status: Every Day    Packs/day: 0.50    Types: Cigarettes   Smokeless tobacco: Never  Substance Use Topics   Alcohol use: Yes    Alcohol/week: 3.0 standard drinks    Types: 3 Cans of beer per week   Drug use: No    Home Medications Prior to Admission medications   Medication Sig Start Date End Date Taking? Authorizing Provider  levofloxacin (LEVAQUIN) 750 MG tablet Take 1 tablet (750 mg total) by mouth daily for 5 days.  12/28/20 01/02/21 Yes Dartha Lodge, PA-C  predniSONE (DELTASONE) 20 MG tablet Take 3 tablets (60 mg total) by mouth daily for 5 days. 12/28/20 01/02/21 Yes Dartha Lodge, PA-C  albuterol (PROVENTIL HFA;VENTOLIN HFA) 108 (90 BASE) MCG/ACT inhaler Inhale 2 puffs into the lungs every 2 (two) hours as needed for wheezing or shortness of breath (cough). 02/07/14   Street, [place], PA-C  azithromycin (ZITHROMAX) 250 MG tablet Take 250 mg by mouth every Monday, Wednesday, and Friday. 10/06/19   [provider]  capsaicin (ZOSTRIX) 0.025 % cream Apply 1 application topically 2 (two) times daily as needed for pain. 10/04/20   [provider]  cyclobenzaprine (FLEXERIL) 10 MG tablet Take 1 tablet by mouth at bedtime as needed for muscle spasms. 08/08/20   [provider]  fluticasone (FLONASE) 50 MCG/ACT nasal spray Place 2 sprays into both nostrils daily. Patient taking differently: Place 2 sprays into both nostrils daily as needed for allergies. 12/22/18   Fawze, Mina A, PA-C  gabapentin (NEURONTIN) 300 MG capsule Take 300 mg by mouth at bedtime.    [provider]  ibuprofen (ADVIL,MOTRIN) 800 MG tablet Take 800 mg by mouth every 8 (eight) hours as needed for moderate pain.    [provider]  mometasone [facility]) 220 MCG/INH inhaler Inhale 2 puffs into the  lungs at bedtime. 09/06/19   [provider]  Tiotropium Bromide-Olodaterol 2.5-2.5 MCG/ACT AERS Inhale 2 puffs into the lungs in the morning. 09/06/19   [provider]    Allergies    Patient has no known allergies.  Review of Systems   Review of Systems  Constitutional:  Negative for chills and fever.  HENT: Negative.    Respiratory:  Positive for cough and shortness of breath.   Cardiovascular:  Negative for chest pain.  Gastrointestinal:  Negative for abdominal pain, nausea and vomiting.  Musculoskeletal:  Negative for myalgias.  Neurological:  Negative for dizziness, syncope,  light-headedness and headaches.  All other systems reviewed and are negative.  Physical Exam Updated Vital Signs BP (!) 147/93 (BP Location: Right Arm)   Pulse (!) 110   Temp 97.9 F (36.6 C) (Oral)   Resp (!) 22   Ht 5\' 9"  (1.753 m)   Wt 74.8 kg   SpO2 92%   BMI 24.37 kg/m   Physical Exam Vitals and nursing note reviewed.  Constitutional:      General: He is not in acute distress.    Appearance: Normal appearance. He is well-developed. He is not ill-appearing or diaphoretic.  HENT:     Head: Normocephalic and atraumatic.     Mouth/Throat:     Mouth: Mucous membranes are moist.     Pharynx: Oropharynx is clear.  Eyes:     General:        Right eye: No discharge.        Left eye: No discharge.  Cardiovascular:     Rate and Rhythm: Regular rhythm. Tachycardia present.     Heart sounds: Normal heart sounds.  Pulmonary:     Effort: Tachypnea present. No respiratory distress.     Breath sounds: Decreased breath sounds and wheezing present. No rhonchi or rales.     Comments: Patient is mildly tachypneic, pursed lip breathing noted but patient is not in acute respiratory distress, no accessory muscle use, he has decreased breath sounds bilaterally with expiratory wheezing and forced expiratory phase, frequent coughing during exam Chest:     Chest wall: No tenderness.  Abdominal:     General: Bowel sounds are normal.     Palpations: Abdomen is soft. There is no mass.     Tenderness: There is no abdominal tenderness.  Musculoskeletal:     Right lower leg: No tenderness. No edema.     Left lower leg: No tenderness. No edema.  Skin:    General: Skin is warm and dry.  Neurological:     Mental Status: He is alert and oriented to person, place, and time.     Coordination: Coordination normal.  Psychiatric:        Mood and Affect: Mood normal.        Behavior: Behavior normal.    ED Results / Procedures / Treatments   Labs (all labs ordered are listed, but only abnormal  results are displayed) Labs Reviewed  BASIC METABOLIC PANEL - Abnormal; Notable for the following components:      Result Value   Sodium 133 (*)    Chloride 96 (*)    Glucose, Bld 113 (*)    All other components within normal limits  CBC WITH DIFFERENTIAL/PLATELET - Abnormal; Notable for the following components:   Platelets 513 (*)    All other components within normal limits  RESP PANEL BY RT-PCR (FLU A&B, COVID) [id number]    EKG None  Radiology DG Chest  2 View  Result Date: 12/28/2020 CLINICAL DATA:  Shortness of breath, cough, body aches and weakness. EXAM: CHEST - 2 VIEW COMPARISON:  10/30/2020 FINDINGS: Heart size is normal. Background pattern of chronic lung disease. Worsened/newly seen perihilar infiltrate on the right/right middle lobe infiltrate consistent with pneumonia. No lobar consolidation or collapse. IMPRESSION: Chronic lung disease. Worsened perihilar infiltrate on the right/right middle lobe infiltrate consistent with bronchopneumonia. Electronically Signed   By: Paulina Fusi M.D.   On: 12/28/2020 15:24    Procedures Procedures   Medications Ordered in ED Medications  ipratropium-albuterol (DUONEB) 0.5-2.5 (3) MG/3ML nebulizer solution 3 mL (3 mLs Nebulization Given 12/28/20 1535)  methylPREDNISolone sodium succinate (SOLU-MEDROL) 125 mg/2 mL injection 125 mg (125 mg Intravenous Given 12/28/20 1535)  ipratropium-albuterol (DUONEB) 0.5-2.5 (3) MG/3ML nebulizer solution 3 mL (3 mLs Nebulization Given 12/28/20 1632)  amoxicillin-clavulanate (AUGMENTIN) 875-125 MG per tablet 1 tablet (1 tablet Oral Given 12/28/20 1631)  albuterol (VENTOLIN HFA) 108 (90 Base) MCG/ACT inhaler 2 puff (2 puffs Inhalation Provided for home use 12/28/20 1728)    ED Course  I have reviewed the triage vital signs and the nursing notes.  Pertinent labs & imaging results that were available during my care of the patient were reviewed by me and considered in my medical decision making (see  chart for details).    MDM Rules/Calculators/A&P                           55 year old male with history of COPD and bronchiectasis presents to the ED with worsening cough and shortness of breath.  On arrival patient is tachypneic and tachycardic but did just use his albuterol inhaler prior to arrival, satting in the low 90s on room air with some mildly increased work of breathing but no accessory muscle use, not in acute respiratory distress.  On auscultation he has decreased breath sounds and wheezing noted bilaterally.  High clinical suspicion for potential COPD exacerbation also concern for possible pneumonia, COVID, flu, patient does not appear fluid overloaded low suspicion for CHF or ACS.  Doubt PE given wheezing on exam feel this is more likely related to patient's COPD.  Will evaluate with labs, COVID and flu testing and chest x-ray.  Patient treated with DuoNeb and IV Solu-Medrol.  I have independently ordered, reviewed and interpreted all labs and imaging: CBC: No leukocytosis and normal hemoglobin BMP: Very mild hyponatremia and hypochloremia no other significant electrolyte derangements, normal renal function COVID and flu testing negative  Chest x-ray with chronic lung disease, some worsened right-sided perihilar infiltrates when compared to prior x-rays concerning for bronchopneumonia.  Given patient's COPD status and that he is already on 3 times weekly suppressive azithromycin discussed possible admission for bronchopneumonia but patient reports that he has a dog at home that does not do well with other humans and he does not have other arrangements for this dog, so would prefer not to be admitted today.  I discussed with him the potential risks of his symptoms worsening if he is not monitored further in the emergency department but he would prefer to continue to treat this as an outpatient.  He has improved significantly after 2 DuoNeb's and he has little to no wheezing and  significantly improved air movement, O2 sats have improved on room air to the mid 90s, and he has no increased work of breathing  Will discharge home with steroid burst and will treat with Levaquin for bronchopneumonia and have  patient continue his 3 times weekly azithromycin.  Stressed the importance of close follow-up with his pulmonologist at the [place] and strict return precautions.  Patient reports that if he feels that his breathing is worsening he will return and plan to be admitted.  He expresses understanding and agreement with plan and is discharged home in good condition.  Final Clinical Impression(s) / ED Diagnoses Final diagnoses:  Bronchopneumonia  COPD exacerbation (HCC)    Rx / DC Orders ED Discharge Orders          Ordered    levofloxacin (LEVAQUIN) 750 MG tablet  Daily        12/28/20 1723    predniSONE (DELTASONE) 20 MG tablet  Daily        12/28/20 1723             Dartha Lodge, Cordelia Poche 12/28/20 2211    Ernie Avena, MD 12/29/20 [phone]

## 2020-12-28 NOTE — ED Triage Notes (Signed)
Pt. Arrived POV c/o body ache, coughing, abdominal pain and chills x1 week. Has history of COPD, but reports it feels different.

## 2020-12-28 NOTE — ED Provider Notes (Signed)
Emergency Medicine Provider Triage Evaluation Note  Jesse Powell , a 55 y.o. male  was evaluated in triage.  Pt complains of productive cough onset x 1 week. Cough is worse at night. Used inhaler before he came. Pt with history of COPD. Has chest wall pain and abdominal pain due to coughing. Denies chest pain, abdominal pain, nausea, or vomiting.   Review of Systems  Positive: Shortness of breath, cough Negative: Chest pain  Physical Exam  BP (!) 147/93 (BP Location: Right Arm)   Pulse (!) 118   Temp 97.9 F (36.6 C) (Oral)   Resp (!) 22   SpO2 91% Comment: pts o2 drops to 87% when walking and talking Gen:   Awake, no distress   Resp:  Increased work of breathing MSK:   Moves extremities without difficulty.  Forced expiratory phase.  Wheezing noted.  Medical Decision Making  Medically screening exam initiated at 2:13 PM.  Appropriate orders placed.  Carlon Chaloux was informed that the remainder of the evaluation will be completed by another provider, this initial triage assessment does not replace that evaluation, and the importance of remaining in the ED until their evaluation is complete.    Gracyn Santillanes A, PA-C 12/28/20 1421    Ernie Avena, MD 12/28/20 [phone]

## 2020-12-28 NOTE — ED Notes (Signed)
PT provided with a [place] sandwich as well as a sprite.

## 2021-02-01 ENCOUNTER — Other Ambulatory Visit: Payer: Self-pay

## 2021-02-01 ENCOUNTER — Encounter (HOSPITAL_COMMUNITY): Payer: Self-pay | Admitting: Emergency Medicine

## 2021-02-01 ENCOUNTER — Emergency Department (HOSPITAL_COMMUNITY): Payer: Medicaid Other

## 2021-02-01 ENCOUNTER — Inpatient Hospital Stay (HOSPITAL_COMMUNITY)
Admission: EM | Admit: 2021-02-01 | Discharge: 2021-02-02 | DRG: 191 | Disposition: A | Discharge status: Home / Self Care | Payer: Medicaid Other | Attending: Internal Medicine | Admitting: Internal Medicine

## 2021-02-01 DIAGNOSIS — Z85118 Personal history of other malignant neoplasm of bronchus and lung: Secondary | ICD-10-CM

## 2021-02-01 DIAGNOSIS — Z902 Acquired absence of lung [part of]: Secondary | ICD-10-CM | POA: Diagnosis not present

## 2021-02-01 DIAGNOSIS — E871 Hypo-osmolality and hyponatremia: Secondary | ICD-10-CM | POA: Diagnosis present

## 2021-02-01 DIAGNOSIS — Z79899 Other long term (current) drug therapy: Secondary | ICD-10-CM | POA: Diagnosis not present

## 2021-02-01 DIAGNOSIS — J441 Chronic obstructive pulmonary disease with (acute) exacerbation: Secondary | ICD-10-CM | POA: Diagnosis present

## 2021-02-01 DIAGNOSIS — F17210 Nicotine dependence, cigarettes, uncomplicated: Secondary | ICD-10-CM | POA: Diagnosis present

## 2021-02-01 DIAGNOSIS — Z20822 Contact with and (suspected) exposure to covid-19: Secondary | ICD-10-CM | POA: Diagnosis present

## 2021-02-01 LAB — RESPIRATORY PANEL BY PCR

## 2021-02-01 LAB — BASIC METABOLIC PANEL
Anion gap: 7 (ref 5–15)
BUN: 9 mg/dL (ref 6–20)
CO2: 29 mmol/L (ref 22–32)
Calcium: 8.8 mg/dL — ABNORMAL LOW (ref 8.9–10.3)
Chloride: 95 mmol/L — ABNORMAL LOW (ref 98–111)
Creatinine, Ser: 0.79 mg/dL (ref 0.61–1.24)
GFR, Estimated: >60 mL/min (ref >60)
Glucose, Bld: 112 mg/dL — ABNORMAL HIGH (ref 70–99)
Potassium: 4.1 mmol/L (ref 3.5–5.1)
Sodium: 131 mmol/L — ABNORMAL LOW (ref 135–145)

## 2021-02-01 LAB — CBC WITH DIFFERENTIAL/PLATELET
Abs Immature Granulocytes: 0.01 10*3/uL (ref 0.00–0.07)
Basophils Absolute: 0 10*3/uL (ref 0.0–0.1)
Basophils Relative: 1 %
Eosinophils Absolute: 0.3 10*3/uL (ref 0.0–0.5)
Eosinophils Relative: 5 %
HCT: 45.4 % (ref 39.0–52.0)
Hemoglobin: 15.5 g/dL (ref 13.0–17.0)
Immature Granulocytes: 0 %
Lymphocytes Relative: 24 %
Lymphs Abs: 1.6 10*3/uL (ref 0.7–4.0)
MCH: 31.8 pg (ref 26.0–34.0)
MCHC: 34.1 g/dL (ref 30.0–36.0)
MCV: 93.2 fL (ref 80.0–100.0)
Monocytes Absolute: 0.4 10*3/uL (ref 0.1–1.0)
Monocytes Relative: 6 %
Neutro Abs: 4.2 10*3/uL (ref 1.7–7.7)
Neutrophils Relative %: 64 %
Platelets: 362 10*3/uL (ref 150–400)
RBC: 4.87 MIL/uL (ref 4.22–5.81)
RDW: 13.7 % (ref 11.5–15.5)
WBC: 6.5 10*3/uL (ref 4.0–10.5)
nRBC: 0 % (ref 0.0–0.2)

## 2021-02-01 LAB — CBC
HCT: 44.5 % (ref 39.0–52.0)
Hemoglobin: 14.9 g/dL (ref 13.0–17.0)
MCH: 31.2 pg (ref 26.0–34.0)
MCHC: 33.5 g/dL (ref 30.0–36.0)
MCV: 93.1 fL (ref 80.0–100.0)
Platelets: 406 10*3/uL — ABNORMAL HIGH (ref 150–400)
RBC: 4.78 MIL/uL (ref 4.22–5.81)
RDW: 13.6 % (ref 11.5–15.5)
WBC: 6.7 10*3/uL (ref 4.0–10.5)
nRBC: 0 % (ref 0.0–0.2)

## 2021-02-01 LAB — RESP PANEL BY RT-PCR (FLU A&B, COVID) ARPGX2
Influenza A by PCR: NEGATIVE
Influenza B by PCR: NEGATIVE
SARS Coronavirus 2 by RT PCR: NEGATIVE

## 2021-02-01 LAB — CREATININE, SERUM
Creatinine, Ser: 0.75 mg/dL (ref 0.61–1.24)
GFR, Estimated: >60 mL/min (ref >60)

## 2021-02-01 LAB — D-DIMER, QUANTITATIVE: D-Dimer, Quant: 0.41 ug/mL-FEU (ref 0.00–0.50)

## 2021-02-01 LAB — BRAIN NATRIURETIC PEPTIDE: B Natriuretic Peptide: 11.6 pg/mL (ref 0.0–100.0)

## 2021-02-01 MED ORDER — ONDANSETRON HCL 4 MG PO TABS: 4.0000 mg | ORAL_TABLET | Freq: Four times a day (QID) | ORAL | Status: DC | PRN

## 2021-02-01 MED ORDER — ACETAMINOPHEN 650 MG RE SUPP: 650.0000 mg | Freq: Four times a day (QID) | RECTAL | Status: DC | PRN

## 2021-02-01 MED ORDER — ALBUTEROL SULFATE (2.5 MG/3ML) 0.083% IN NEBU
INHALATION_SOLUTION | RESPIRATORY_TRACT | Status: AC
  Filled 2021-02-01: qty 12

## 2021-02-01 MED ORDER — ALBUTEROL (5 MG/ML) CONTINUOUS INHALATION SOLN
10.0000 mg/h | INHALATION_SOLUTION | Freq: Once | RESPIRATORY_TRACT | Status: AC
  Administered 2021-02-01: 02:00:00 10 mg/h via RESPIRATORY_TRACT

## 2021-02-01 MED ORDER — GUAIFENESIN ER 600 MG PO TB12
600.0000 mg | ORAL_TABLET | Freq: Two times a day (BID) | ORAL | Status: DC
  Administered 2021-02-01 – 2021-02-02 (×3): 600 mg via ORAL
  Filled 2021-02-01 (×3): qty 1

## 2021-02-01 MED ORDER — ONDANSETRON HCL 4 MG/2ML IJ SOLN: 4.0000 mg | Freq: Four times a day (QID) | INTRAMUSCULAR | Status: DC | PRN

## 2021-02-01 MED ORDER — IPRATROPIUM-ALBUTEROL 0.5-2.5 (3) MG/3ML IN SOLN
3.0000 mL | Freq: Once | RESPIRATORY_TRACT | Status: AC
  Administered 2021-02-01: 06:00:00 3 mL via RESPIRATORY_TRACT
  Filled 2021-02-01: qty 3

## 2021-02-01 MED ORDER — GABAPENTIN 300 MG PO CAPS
300.0000 mg | ORAL_CAPSULE | Freq: Once | ORAL | Status: AC
  Administered 2021-02-01: 23:00:00 300 mg via ORAL
  Filled 2021-02-01: qty 1

## 2021-02-01 MED ORDER — IPRATROPIUM-ALBUTEROL 0.5-2.5 (3) MG/3ML IN SOLN: 3.0000 mL | Freq: Four times a day (QID) | RESPIRATORY_TRACT | Status: DC | PRN

## 2021-02-01 MED ORDER — IPRATROPIUM-ALBUTEROL 0.5-2.5 (3) MG/3ML IN SOLN
3.0000 mL | Freq: Four times a day (QID) | RESPIRATORY_TRACT | Status: DC
  Administered 2021-02-01 (×3): 3 mL via RESPIRATORY_TRACT
  Filled 2021-02-01 (×3): qty 3

## 2021-02-01 MED ORDER — ACETAMINOPHEN 325 MG PO TABS: 650.0000 mg | ORAL_TABLET | Freq: Four times a day (QID) | ORAL | Status: DC | PRN

## 2021-02-01 MED ORDER — PREDNISONE 20 MG PO TABS
40.0000 mg | ORAL_TABLET | Freq: Every day | ORAL | Status: DC
  Administered 2021-02-02: 10:00:00 40 mg via ORAL
  Filled 2021-02-01: qty 2

## 2021-02-01 MED ORDER — METHYLPREDNISOLONE SODIUM SUCC 125 MG IJ SOLR
60.0000 mg | Freq: Two times a day (BID) | INTRAMUSCULAR | Status: AC
  Administered 2021-02-01 (×2): 60 mg via INTRAVENOUS
  Filled 2021-02-01 (×2): qty 2

## 2021-02-01 MED ORDER — IPRATROPIUM BROMIDE 0.02 % IN SOLN
0.5000 mg | Freq: Once | RESPIRATORY_TRACT | Status: AC
  Administered 2021-02-01: 02:00:00 0.5 mg via RESPIRATORY_TRACT
  Filled 2021-02-01: qty 2.5

## 2021-02-01 MED ORDER — ENOXAPARIN SODIUM 40 MG/0.4ML IJ SOSY
40.0000 mg | PREFILLED_SYRINGE | INTRAMUSCULAR | Status: DC
  Administered 2021-02-01 – 2021-02-02 (×2): 40 mg via SUBCUTANEOUS
  Filled 2021-02-01 (×2): qty 0.4

## 2021-02-01 NOTE — ED Notes (Signed)
Continuous neb started by Resp tech

## 2021-02-01 NOTE — Progress Notes (Signed)
Patient refused TELE and request to have his gabapentin. MD paged.

## 2021-02-01 NOTE — H&P (Signed)
History and Physical    Jesse Powell GEX:[id number] DOB: 06-Apr-1965 DOA: 02/01/2021  PCP: [facility]  Patient coming from: Home  Chief Complaint: Shortness of breath  HPI: Jesse Powell is a 55 y.o. male with medical history significant of COPD, tobacco abuse. Presenting with shortness of breath. His symptoms started about a week ago. He had cough as well. Activity and exposure to being outside in the cold seem to have worsened his condition. He tried OTC cough meds and his regular inhalers, but they have not helped. His condition has worsen to the point where last night it became intolerable. He decided to come to the ED for help.   ED Course: CXR was negative. COVID/flu negative. He received a couple of hour-long treatments with some improvement. TRH was called for admission.   Review of Systems:  Denies CP, palpitations, N/V/D, fevers, abdominal pain. Review of systems is otherwise negative for all not mentioned in HPI.   PMHx Past Medical History:  Diagnosis Date   COPD (chronic obstructive pulmonary disease) (HCC)     PSHx Past Surgical History:  Procedure Laterality Date   LUNG SURGERY  2019    SocHx  reports that he has been smoking. He has been smoking an average of .5 packs per day. He has never used smokeless tobacco. He reports current alcohol use of about 3.0 standard drinks per week. He reports that he does not use drugs.  No Known Allergies  FamHx History reviewed. No pertinent family history.  Prior to Admission medications   Medication Sig Start Date End Date Taking? Authorizing Provider  albuterol (PROVENTIL HFA;VENTOLIN HFA) 108 (90 BASE) MCG/ACT inhaler Inhale 2 puffs into the lungs every 2 (two) hours as needed for wheezing or shortness of breath (cough). 02/07/14   Street, [place], PA-C  azithromycin (ZITHROMAX) 250 MG tablet Take 250 mg by mouth every Monday, Wednesday, and Friday. 10/06/19   [provider]  capsaicin (ZOSTRIX)  0.025 % cream Apply 1 application topically 2 (two) times daily as needed for pain. 10/04/20   [provider]  cyclobenzaprine (FLEXERIL) 10 MG tablet Take 1 tablet by mouth at bedtime as needed for muscle spasms. 08/08/20   [provider]  fluticasone (FLONASE) 50 MCG/ACT nasal spray Place 2 sprays into both nostrils daily. Patient taking differently: Place 2 sprays into both nostrils daily as needed for allergies. 12/22/18   Fawze, Mina A, PA-C  gabapentin (NEURONTIN) 300 MG capsule Take 300 mg by mouth at bedtime.    [provider]  ibuprofen (ADVIL,MOTRIN) 800 MG tablet Take 800 mg by mouth every 8 (eight) hours as needed for moderate pain.    [provider]  mometasone [facility]) 220 MCG/INH inhaler Inhale 2 puffs into the lungs at bedtime. 09/06/19   [provider]  Tiotropium Bromide-Olodaterol 2.5-2.5 MCG/ACT AERS Inhale 2 puffs into the lungs in the morning. 09/06/19   [provider]    Physical Exam: Vitals:   02/01/21 0145 02/01/21 0150 02/01/21 0521 02/01/21 0539  BP: (!) 161/108  (!) 132/102   Pulse: 87  92   Resp:      Temp:      TempSrc:      SpO2: 100% 94% 95% 92%  Weight:      Height:        General: 55 y.o. male resting in bed in NAD Eyes: PERRL, normal sclera ENMT: Nares patent w/o discharge, orophaynx clear, dentition normal, ears w/o discharge/lesions/ulcers Neck: Supple, trachea midline Cardiovascular:  RRR, +S1, S2, no m/g/r, equal pulses throughout Respiratory: decreased at bases, soft rhonchi on left base, slightly increased WOB on RA GI: BS+, NDNT, no masses noted, no organomegaly noted MSK: No e/c/c Neuro: A&O x 3, no focal deficits Psyc: Appropriate interaction and affect, calm/cooperative  Labs on Admission: I have personally reviewed following labs and imaging studies  CBC: Recent Labs  Lab 02/01/21 0152  WBC 6.5  NEUTROABS 4.2  HGB 15.5  HCT 45.4  MCV 93.2  PLT 362   Basic Metabolic  Panel: Recent Labs  Lab 02/01/21 0152  NA 131*  K 4.1  CL 95*  CO2 29  GLUCOSE 112*  BUN 9  CREATININE 0.79  CALCIUM 8.8*   GFR: Estimated Creatinine Clearance: 106.8 mL/min (by C-G formula based on SCr of 0.79 mg/dL). Liver Function Tests: No results for input(s): AST, ALT, ALKPHOS, BILITOT, PROT, ALBUMIN in the last 168 hours. No results for input(s): LIPASE, AMYLASE in the last 168 hours. No results for input(s): AMMONIA in the last 168 hours. Coagulation Profile: No results for input(s): INR, PROTIME in the last 168 hours. Cardiac Enzymes: No results for input(s): CKTOTAL, CKMB, CKMBINDEX, TROPONINI in the last 168 hours. BNP (last 3 results) No results for input(s): PROBNP in the last 8760 hours. HbA1C: No results for input(s): HGBA1C in the last 72 hours. CBG: No results for input(s): GLUCAP in the last 168 hours. Lipid Profile: No results for input(s): CHOL, HDL, LDLCALC, TRIG, CHOLHDL, LDLDIRECT in the last 72 hours. Thyroid Function Tests: No results for input(s): TSH, T4TOTAL, FREET4, T3FREE, THYROIDAB in the last 72 hours. Anemia Panel: No results for input(s): VITAMINB12, FOLATE, FERRITIN, TIBC, IRON, RETICCTPCT in the last 72 hours. Urine analysis: No results found for: COLORURINE, APPEARANCEUR, LABSPEC, PHURINE, GLUCOSEU, HGBUR, BILIRUBINUR, KETONESUR, PROTEINUR, UROBILINOGEN, NITRITE, LEUKOCYTESUR  Radiological Exams on Admission: DG Chest 2 View  Result Date: 02/01/2021 CLINICAL DATA:  Shortness of breath and weakness EXAM: CHEST - 2 VIEW COMPARISON:  12/28/2020 FINDINGS: Cardiac shadow is within normal limits. The lungs are hyperinflated consistent with COPD. Persistent right basilar nodular changes and perihilar opacities are noted somewhat accentuated by patient rotation to the left. These are stable from prior CT dating back to 2020 incomplete assistant with scarring. No new focal infiltrate is seen. IMPRESSION: Chronic changes without acute abnormality.  Electronically Signed   By: Alcide Clever M.D.   On: 02/01/2021 01:42    EKG: Independently reviewed. sinus  Assessment/Plan COPD exacerbation Dyspnea     - place in obs, tele     - CXR is negative     - BNP is wnl     - COVID/flu negative     - check d-dimer and RVP     - right now treating as COPD exacerbation w/ duonebs, mucinex, O2 as needed; minimal wheeze after steroids w/ EMS, will continue solumedrol for now  Tobacco abuse     - counseled on abstention  Hyponatremia     - mild, follow AM BMP  DVT prophylaxis: lovenox  Code Status: FULL  Family Communication: w/ friend at bedside  Consults called: None   Status is: Observation  The patient remains OBS appropriate and will d/c before 2 midnights.  Teddy Spike DO [facility]  If 7PM-7AM, please contact night-coverage [email or web address]  02/01/2021, 8:03 AM

## 2021-02-01 NOTE — ED Triage Notes (Signed)
PER EMS: pt with hx of COPD and lung cancer wit c/o SOB, dyspneic and tripoding when EMS arrived; was given 125 solumedrol, 2g mag, 5 mg of albuterol and 5 mg of Atrovent. Audible wheezing still present on arrival.  BP- 160/100, HR-112, O2-85% initially now 100%. Denies pain.

## 2021-02-01 NOTE — Plan of Care (Signed)
  Problem: Education: Goal: Knowledge of General Education information will improve Description Including pain rating scale, medication(s)/side effects and non-pharmacologic comfort measures Outcome: Progressing   

## 2021-02-01 NOTE — ED Provider Notes (Signed)
[facility]-EMERGENCY DEPT Provider Note   CSN: [id number] Arrival date & time: 02/01/21  0059     History Chief Complaint  Patient presents with   Shortness of Breath    Jesse Powell is a 55 y.o. male.  The history is provided by the patient and medical records. No language interpreter was used.  Shortness of Breath  55 year old male significant history of COPD who presents with complaints of shortness of breath.  Patient states he sometimes develops COPD exacerbation with abrupt weather changes.  He tries to stay away from the outside when the cold front coming through but he has had his dog out for a walk and he felt that it may have caused his symptoms to flareup.  For the past 3 days he endorsed progressive worsening shortness of breath and wheezing similar to prior COPD exacerbation.  He endorsed increasing coughing wheezing not adequately controlled with his home medication.  No fever.  No exertional chest pain nausea vomiting diarrhea or abdominal pain.  He does admits to tobacco use.  He does not wear oxygen.  No cold symptoms  Past Medical History:  Diagnosis Date   COPD (chronic obstructive pulmonary disease) (HCC)     There are no problems to display for this patient.   Past Surgical History:  Procedure Laterality Date   LUNG SURGERY  2019       No family history on file.  Social History   Tobacco Use   Smoking status: Every Day    Packs/day: 0.50    Types: Cigarettes   Smokeless tobacco: Never  Substance Use Topics   Alcohol use: Yes    Alcohol/week: 3.0 standard drinks    Types: 3 Cans of beer per week   Drug use: No    Home Medications Prior to Admission medications   Medication Sig Start Date End Date Taking? Authorizing Provider  albuterol (PROVENTIL HFA;VENTOLIN HFA) 108 (90 BASE) MCG/ACT inhaler Inhale 2 puffs into the lungs every 2 (two) hours as needed for wheezing or shortness of breath (cough). 02/07/14   Street,  [place], PA-C  azithromycin (ZITHROMAX) 250 MG tablet Take 250 mg by mouth every Monday, Wednesday, and Friday. 10/06/19   [provider]  capsaicin (ZOSTRIX) 0.025 % cream Apply 1 application topically 2 (two) times daily as needed for pain. 10/04/20   [provider]  cyclobenzaprine (FLEXERIL) 10 MG tablet Take 1 tablet by mouth at bedtime as needed for muscle spasms. 08/08/20   [provider]  fluticasone (FLONASE) 50 MCG/ACT nasal spray Place 2 sprays into both nostrils daily. Patient taking differently: Place 2 sprays into both nostrils daily as needed for allergies. 12/22/18   Fawze, Mina A, PA-C  gabapentin (NEURONTIN) 300 MG capsule Take 300 mg by mouth at bedtime.    [provider]  ibuprofen (ADVIL,MOTRIN) 800 MG tablet Take 800 mg by mouth every 8 (eight) hours as needed for moderate pain.    [provider]  mometasone [facility]) 220 MCG/INH inhaler Inhale 2 puffs into the lungs at bedtime. 09/06/19   [provider]  Tiotropium Bromide-Olodaterol 2.5-2.5 MCG/ACT AERS Inhale 2 puffs into the lungs in the morning. 09/06/19   [provider]    Allergies    Patient has no known allergies.  Review of Systems   Review of Systems  Respiratory:  Positive for shortness of breath.   All other systems reviewed and are negative.  Physical Exam Updated Vital Signs BP (!) 167/113 (BP  Location: Right Arm)    Pulse 92    Temp 97.7 F (36.5 C) (Oral)    Resp (!) 24    Ht 5\' 9"  (1.753 m)    Wt 74.8 kg    SpO2 98%    BMI 24.37 kg/m   Physical Exam Vitals and nursing note reviewed.  Constitutional:      General: He is not in acute distress.    Appearance: He is well-developed.  HENT:     Head: Atraumatic.  Eyes:     Conjunctiva/sclera: Conjunctivae normal.  Cardiovascular:     Rate and Rhythm: Normal rate and regular rhythm.  Pulmonary:     Comments: Patient is tachypneic, decreased lung sounds with expiratory  wheezes. Abdominal:     Palpations: Abdomen is soft.     Tenderness: There is no abdominal tenderness.  Musculoskeletal:     Cervical back: Neck supple.     Right lower leg: No edema.     Left lower leg: No edema.  Skin:    Findings: No rash.  Neurological:     Mental Status: He is alert.  Psychiatric:        Mood and Affect: Mood normal.    ED Results / Procedures / Treatments   Labs (all labs ordered are listed, but only abnormal results are displayed) Labs Reviewed  BASIC METABOLIC PANEL - Abnormal; Notable for the following components:      Result Value   Sodium 131 (*)    Chloride 95 (*)    Glucose, Bld 112 (*)    Calcium 8.8 (*)    All other components within normal limits  RESP PANEL BY RT-PCR (FLU A&B, COVID) ARPGX2  BRAIN NATRIURETIC PEPTIDE  CBC WITH DIFFERENTIAL/PLATELET    EKG EKG Interpretation  Date/Time:  Thursday February 01 2021 01:20:17 EST Ventricular Rate:  93 PR Interval:  132 QRS Duration: 91 QT Interval:  391 QTC Calculation: 487 R Axis:   88 Text Interpretation: Sinus rhythm Ventricular premature complex Aberrant conduction of SV complex(es) ST elev, probable normal early repol pattern Borderline prolonged QT interval Confirmed by [id number]) on 02/01/2021 2:35:22 AM  Radiology DG Chest 2 View  Result Date: 02/01/2021 CLINICAL DATA:  Shortness of breath and weakness EXAM: CHEST - 2 VIEW COMPARISON:  12/28/2020 FINDINGS: Cardiac shadow is within normal limits. The lungs are hyperinflated consistent with COPD. Persistent right basilar nodular changes and perihilar opacities are noted somewhat accentuated by patient rotation to the left. These are stable from prior CT dating back to 2020 incomplete assistant with scarring. No new focal infiltrate is seen. IMPRESSION: Chronic changes without acute abnormality. Electronically Signed   By: 2021 M.D.   On: 02/01/2021 01:42    Procedures Procedures   Medications Ordered in  ED Medications  albuterol (PROVENTIL,VENTOLIN) solution continuous neb (10 mg/hr Nebulization Given 02/01/21 0150)  ipratropium (ATROVENT) nebulizer solution 0.5 mg (0.5 mg Nebulization Given 02/01/21 0150)  ipratropium-albuterol (DUONEB) 0.5-2.5 (3) MG/3ML nebulizer solution 3 mL (3 mLs Nebulization Given 02/01/21 0539)    ED Course  I have reviewed the triage vital signs and the nursing notes.  Pertinent labs & imaging results that were available during my care of the patient were reviewed by me and considered in my medical decision making (see chart for details).    MDM Rules/Calculators/A&P                         BP (!) 132/102 (  BP Location: Right Arm)    Pulse 92    Temp 97.7 F (36.5 C) (Oral)    Resp (!) 24    Ht 5\' 9"  (1.753 m)    Wt 74.8 kg    SpO2 92%    BMI 24.37 kg/m      Final Clinical Impression(s) / ED Diagnoses Final diagnoses:  COPD exacerbation (HCC)    Rx / DC Orders ED Discharge Orders     None      1:50 AM Patient with history of COPD here with increased shortness of breath concerning for COPD exacerbation.  EMS initially noted that patient was dyspneic and tripoding when EMS arrived.  Patient did receive 125 mg of Solu-Medrol, 2 g of Lasix, 5 mg of albuterol and 5 mg of Atrovent.  Initial O2 was 85%, was placed on 4 L of oxygen with improvement of his symptoms.  Work up initiated.   6:35 AM Despite receiving hour-long as follows with another duo nebs, patient states he is back to his normal baseline.  On exam lung sounds bit better but still a bit tight.  Scattered wheezes heard.  At rest, O2 sat is 92%.  I have consulted [facility] hospital[facility], Dr. who agrees to see him admit patient for COPD exacerbation.   Imogene Burn, PA-C 02/01/21 02/03/21    [id number], MD 02/01/21 2256

## 2021-02-01 NOTE — ED Notes (Signed)
Ambulated pt roughly 100 ft. Pt O2 86-83%. Pt had stumbling gait and complained of SOB

## 2021-02-01 NOTE — Progress Notes (Signed)
Unable to get abg. Attempted x 2.MD notified.

## 2021-02-02 LAB — COMPREHENSIVE METABOLIC PANEL
ALT: 11 U/L (ref 0–44)
AST: 19 U/L (ref 15–41)
Albumin: 3.8 g/dL (ref 3.5–5.0)
Alkaline Phosphatase: 62 U/L (ref 38–126)
Anion gap: 9 (ref 5–15)
BUN: 17 mg/dL (ref 6–20)
CO2: 23 mmol/L (ref 22–32)
Calcium: 9.5 mg/dL (ref 8.9–10.3)
Chloride: 101 mmol/L (ref 98–111)
Creatinine, Ser: 0.82 mg/dL (ref 0.61–1.24)
GFR, Estimated: >60 mL/min (ref >60)
Glucose, Bld: 126 mg/dL — ABNORMAL HIGH (ref 70–99)
Potassium: 4.8 mmol/L (ref 3.5–5.1)
Sodium: 133 mmol/L — ABNORMAL LOW (ref 135–145)
Total Bilirubin: 0.5 mg/dL (ref 0.3–1.2)
Total Protein: 7.3 g/dL (ref 6.5–8.1)

## 2021-02-02 LAB — CBC
HCT: 40.7 % (ref 39.0–52.0)
Hemoglobin: 13.8 g/dL (ref 13.0–17.0)
MCH: 31.3 pg (ref 26.0–34.0)
MCHC: 33.9 g/dL (ref 30.0–36.0)
MCV: 92.3 fL (ref 80.0–100.0)
Platelets: 386 10*3/uL (ref 150–400)
RBC: 4.41 MIL/uL (ref 4.22–5.81)
RDW: 13.6 % (ref 11.5–15.5)
WBC: 10 10*3/uL (ref 4.0–10.5)
nRBC: 0 % (ref 0.0–0.2)

## 2021-02-02 MED ORDER — IPRATROPIUM-ALBUTEROL 0.5-2.5 (3) MG/3ML IN SOLN
3.0000 mL | Freq: Three times a day (TID) | RESPIRATORY_TRACT | Status: DC
  Administered 2021-02-02: 08:00:00 3 mL via RESPIRATORY_TRACT
  Filled 2021-02-02: qty 3

## 2021-02-02 MED ORDER — FLUTICASONE PROPIONATE 50 MCG/ACT NA SUSP: 1.0000 | Freq: Every day | NASAL | 2 refills | Status: DC

## 2021-02-02 MED ORDER — PREDNISONE 20 MG PO TABS: ORAL_TABLET | ORAL | 0 refills | Status: AC

## 2021-02-02 MED ORDER — GUAIFENESIN ER 600 MG PO TB12: 600.0000 mg | ORAL_TABLET | Freq: Two times a day (BID) | ORAL | 0 refills | Status: AC

## 2021-02-02 NOTE — Discharge Instructions (Signed)
If you wish to quit smoking, help is available.  For free tobacco cessation program offerings call the [facility] at 2147557863 or Live Well Line at 971-008-5768. You may also visit [email or web address] or email [email or web address]  for more information on other programs.   You were cared for by a hospitalist during your hospital stay. If you have any questions about your discharge medications or the care you received while you were in the hospital after you are discharged, you can call the unit and asked to speak with the hospitalist on call if the hospitalist that took care of you is not available. Once you are discharged, your primary care physician will handle any further medical issues.   Please note that NO REFILLS for any discharge medications will be authorized once you are discharged, as it is imperative that you return to your primary care physician (or establish a relationship with a primary care physician if you do not have one) for your aftercare needs so that they can reassess your need for medications and monitor your lab values.  Please take all your medications with you for your next visit with your Primary MD. Please ask your Primary MD to get all Hospital records sent to his/her office. Please request your Primary MD to go over all hospital test results at the follow up.   If you experience worsening of your admission symptoms, develop shortness of breath, chest pain, suicidal or homicidal thoughts or a life threatening emergency, you must seek medical attention immediately by calling 911 or calling your MD.   Bonita Quin must read the complete instructions/literature along with all the possible adverse reactions/side effects for all the medicines you take including new medications that have been prescribed to you. Take new medicines after you have completely understood and accpet all the possible adverse reactions/side effects.    Do not drive when taking pain  medications or sedatives.     Do not take more than prescribed Pain, Sleep and Anxiety Medications   If you have smoked or chewed Tobacco in the last 2 yrs please stop. Stop any regular alcohol  and or recreational drug use.   Wear Seat belts while driving.

## 2021-02-02 NOTE — Discharge Summary (Signed)
Physician Discharge Summary  Jesse Powell XIP:[id number] DOB: [birth year] DOA: 02/01/2021  PCP: [facility]  Admit date: 02/01/2021 Discharge date: 02/02/2021  Admitted From: home  Disposition:  home   Recommendations for Outpatient Follow-up:  Continue to encourage smoking cessation  Home Health:  none  Discharge Condition:  stable   CODE STATUS:  full code   Diet recommendation:  regular  Consultations: none  Procedures/Studies: none   Discharge Diagnoses:  Principal Problem:   COPD exacerbation (HCC) Chronic cystic bronchiectasis  Hx of L Lung adenocarcinoma s/p resection 2019      Brief Summary: Jesse Powell is a 55 y.o. male with medical history significant of COPD, tobacco abuse. Presenting with shortness of breath. His symptoms started about a week ago. He had cough as well. Activity and exposure to being outside in the cold seem to have worsened his condition. He tried OTC cough meds and his regular inhalers, but they have not helped. His condition has worsen to the point where last night it became intolerable. He decided to come to the ED for help.     Hospital Course:  COPD exacerbation with h/o tobacco abuse- h/o bronchiectasis - has improved today- not hypoxic but continues to cough and have a lot of mucous- states that the Prednisone course is usually short and he fills up with mucous as soon as it is stopped and is then unable to sleep flat and sleeps in a chair - have given him a 10 day taper of Prednisone and Mucinex script -also states that he has been having severe PND for 2 weeks- have added Flonase - cont Azithromycin 3 x wk - f/u with Dr Shelle Iron at the [facility] - advised him to stop smoking  H/o Lung adenocarcinoma s/p resection 2019 - in remission  Discharge Exam: Vitals:   02/02/21 0357 02/02/21 0803  BP: (!) 152/94   Pulse: 96 (!) 106  Resp: 20 20  Temp: 98 F (36.7 C)   SpO2: 94% 94%   Vitals:   02/01/21 2037 02/01/21 2318  02/02/21 0357 02/02/21 0803  BP: (!) 136/93 129/89 (!) 152/94   Pulse: (!) 106 (!) 109 96 (!) 106  Resp:  18 20 20   Temp: (!) 97.4 F (36.3 C) 98.2 F (36.8 C) 98 F (36.7 C)   TempSrc: Oral Oral Oral   SpO2: 92% 93% 94% 94%  Weight:      Height:        General: Pt is alert, awake, not in acute distress Cardiovascular: RRR, S1/S2 +, no rubs, no gallops Respiratory: CTA bilaterally, no wheezing, no rhonchi Abdominal: Soft, NT, ND, bowel sounds + Extremities: no edema, no cyanosis   Discharge Instructions  Discharge Instructions     Diet - low sodium heart healthy   Complete by: As directed    Increase activity slowly   Complete by: As directed       Allergies as of 02/02/2021   No Known Allergies      Medication List     STOP taking these medications    guaiFENesin 100 MG/5ML liquid Commonly known as: ROBITUSSIN Replaced by: guaiFENesin 600 MG 12 hr tablet       TAKE these medications    albuterol 108 (90 Base) MCG/ACT inhaler Commonly known as: VENTOLIN HFA Inhale 2 puffs into the lungs every 2 (two) hours as needed for wheezing or shortness of breath (cough).   azithromycin 250 MG tablet Commonly known as: ZITHROMAX Take 250 mg by mouth every  Monday, Wednesday, and Friday.   cyclobenzaprine 10 MG tablet Commonly known as: FLEXERIL Take 1 tablet by mouth at bedtime as needed for muscle spasms.   fluticasone 50 MCG/ACT nasal spray Commonly known as: Flonase Place 1 spray into both nostrils daily.   gabapentin 300 MG capsule Commonly known as: NEURONTIN Take 300-600 mg by mouth at bedtime.   guaiFENesin 600 MG 12 hr tablet Commonly known as: MUCINEX Take 1 tablet (600 mg total) by mouth 2 (two) times daily. Replaces: guaiFENesin 100 MG/5ML liquid   ibuprofen 800 MG tablet Commonly known as: ADVIL Take 800 mg by mouth every 8 (eight) hours as needed for moderate pain.   mometasone 220 MCG/INH inhaler Commonly known as: ASMANEX Inhale 2  puffs into the lungs at bedtime.   predniSONE 20 MG tablet Commonly known as: DELTASONE Take 2 tablets (40 mg total) by mouth daily with breakfast for 2 days, THEN 1.5 tablets (30 mg total) daily with breakfast for 2 days, THEN 1 tablet (20 mg total) daily with breakfast for 2 days, THEN 0.5 tablets (10 mg total) daily with breakfast for 4 days. Start taking on: February 03, 2021   Tiotropium Bromide-Olodaterol 2.5-2.5 MCG/ACT Aers Inhale 2 puffs into the lungs in the morning.        Follow-up Information     [facility] Schedule an appointment as soon as possible for a visit in 1 week(s).   Contact information: [address] [facility] [place] [address] [phone]                No Known Allergies    DG Chest 2 View  Result Date: 02/01/2021 CLINICAL DATA:  Shortness of breath and weakness EXAM: CHEST - 2 VIEW COMPARISON:  12/28/2020 FINDINGS: Cardiac shadow is within normal limits. The lungs are hyperinflated consistent with COPD. Persistent right basilar nodular changes and perihilar opacities are noted somewhat accentuated by patient rotation to the left. These are stable from prior CT dating back to 2020 incomplete assistant with scarring. No new focal infiltrate is seen. IMPRESSION: Chronic changes without acute abnormality. Electronically Signed   By: Alcide Clever M.D.   On: 02/01/2021 01:42     The results of significant diagnostics from this hospitalization (including imaging, microbiology, ancillary and laboratory) are listed below for reference.     Microbiology: Recent Results (from the past 240 hour(s))  Resp Panel by RT-PCR (Flu A&B, Covid) Nasopharyngeal Swab     Status: None   Collection Time: 02/01/21  1:52 AM   Specimen: Nasopharyngeal Swab; Nasopharyngeal(NP) swabs in vial transport medium  Result Value Ref Range Status   SARS Coronavirus 2 by RT PCR NEGATIVE NEGATIVE Final    Comment: (NOTE) SARS-CoV-2 target nucleic  acids are NOT DETECTED.  The SARS-CoV-2 RNA is generally detectable in upper respiratory specimens during the acute phase of infection. The lowest concentration of SARS-CoV-2 viral copies this assay can detect is 138 copies/mL. A negative result does not preclude SARS-Cov-2 infection and should not be used as the sole basis for treatment or other patient management decisions. A negative result may occur with  improper specimen collection/handling, submission of specimen other than nasopharyngeal swab, presence of viral mutation(s) within the areas targeted by this assay, and inadequate number of viral copies(<138 copies/mL). A negative result must be combined with clinical observations, patient history, and epidemiological information. The expected result is Negative.  Fact Sheet for Patients:  BloggerCourse.com  Fact Sheet for Healthcare Providers:  SeriousBroker.it  This test  is no t yet approved or cleared by the [place] and  has been authorized for detection and/or diagnosis of SARS-CoV-2 by FDA under an Emergency Use Authorization (EUA). This EUA will remain  in effect (meaning this test can be used) for the duration of the COVID-19 declaration under Section 564(b)(1) of the Act, 21 U.S.C.section 360bbb-3(b)(1), unless the authorization is terminated  or revoked sooner.       Influenza A by PCR NEGATIVE NEGATIVE Final   Influenza B by PCR NEGATIVE NEGATIVE Final    Comment: (NOTE) The Xpert Xpress SARS-CoV-2/FLU/RSV plus assay is intended as an aid in the diagnosis of influenza from Nasopharyngeal swab specimens and should not be used as a sole basis for treatment. Nasal washings and aspirates are unacceptable for Xpert Xpress SARS-CoV-2/FLU/RSV testing.  Fact Sheet for Patients: BloggerCourse.com  Fact Sheet for Healthcare Providers: SeriousBroker.it  This  test is not yet approved or cleared by the [place] FDA and has been authorized for detection and/or diagnosis of SARS-CoV-2 by FDA under an Emergency Use Authorization (EUA). This EUA will remain in effect (meaning this test can be used) for the duration of the COVID-19 declaration under Section 564(b)(1) of the Act, 21 U.S.C. section 360bbb-3(b)(1), unless the authorization is terminated or revoked.  Performed at [facility], [address] [place], [place] [address]   Respiratory (~20 pathogens) panel by PCR     Status: None   Collection Time: 02/01/21 10:44 AM   Specimen: Nasopharyngeal Swab; Respiratory  Result Value Ref Range Status   Adenovirus NOT DETECTED NOT DETECTED Final   Coronavirus 229E NOT DETECTED NOT DETECTED Final    Comment: (NOTE) The Coronavirus on the Respiratory Panel, DOES NOT test for the novel  Coronavirus (2019 nCoV)    Coronavirus HKU1 NOT DETECTED NOT DETECTED Final   Coronavirus NL63 NOT DETECTED NOT DETECTED Final   Coronavirus OC43 NOT DETECTED NOT DETECTED Final   Metapneumovirus NOT DETECTED NOT DETECTED Final   Rhinovirus / Enterovirus NOT DETECTED NOT DETECTED Final   Influenza A NOT DETECTED NOT DETECTED Final   Influenza B NOT DETECTED NOT DETECTED Final   Parainfluenza Virus 1 NOT DETECTED NOT DETECTED Final   Parainfluenza Virus 2 NOT DETECTED NOT DETECTED Final   Parainfluenza Virus 3 NOT DETECTED NOT DETECTED Final   Parainfluenza Virus 4 NOT DETECTED NOT DETECTED Final   Respiratory Syncytial Virus NOT DETECTED NOT DETECTED Final   Bordetella pertussis NOT DETECTED NOT DETECTED Final   Bordetella Parapertussis NOT DETECTED NOT DETECTED Final   Chlamydophila pneumoniae NOT DETECTED NOT DETECTED Final   Mycoplasma pneumoniae NOT DETECTED NOT DETECTED Final    Comment: Performed at [facility], [address], [place], [place] [address]     Labs: BNP (last 3 results) Recent Labs    02/01/21 0152   BNP 11.6   Basic Metabolic Panel: Recent Labs  Lab 02/01/21 0152 02/01/21 1044 02/02/21 0533  NA 131*  --  133*  K 4.1  --  4.8  CL 95*  --  101  CO2 29  --  23  GLUCOSE 112*  --  126*  BUN 9  --  17  CREATININE 0.79 0.75 0.82  CALCIUM 8.8*  --  9.5   Liver Function Tests: Recent Labs  Lab 02/02/21 0533  AST 19  ALT 11  ALKPHOS 62  BILITOT 0.5  PROT 7.3  ALBUMIN 3.8   No results for input(s): LIPASE, AMYLASE in the  last 168 hours. No results for input(s): AMMONIA in the last 168 hours. CBC: Recent Labs  Lab 02/01/21 0152 02/01/21 1044 02/02/21 0533  WBC 6.5 6.7 10.0  NEUTROABS 4.2  --   --   HGB 15.5 14.9 13.8  HCT 45.4 44.5 40.7  MCV 93.2 93.1 92.3  PLT 362 406* 386   Cardiac Enzymes: No results for input(s): CKTOTAL, CKMB, CKMBINDEX, TROPONINI in the last 168 hours. BNP: Invalid input(s): POCBNP CBG: No results for input(s): GLUCAP in the last 168 hours. D-Dimer Recent Labs    02/01/21 1044  DDIMER 0.41   Hgb A1c No results for input(s): HGBA1C in the last 72 hours. Lipid Profile No results for input(s): CHOL, HDL, LDLCALC, TRIG, CHOLHDL, LDLDIRECT in the last 72 hours. Thyroid function studies No results for input(s): TSH, T4TOTAL, T3FREE, THYROIDAB in the last 72 hours.  Invalid input(s): FREET3 Anemia work up No results for input(s): VITAMINB12, FOLATE, FERRITIN, TIBC, IRON, RETICCTPCT in the last 72 hours. Urinalysis No results found for: COLORURINE, APPEARANCEUR, LABSPEC, PHURINE, GLUCOSEU, HGBUR, BILIRUBINUR, KETONESUR, PROTEINUR, UROBILINOGEN, NITRITE, LEUKOCYTESUR Sepsis Labs Invalid input(s): PROCALCITONIN,  WBC,  LACTICIDVEN Microbiology Recent Results (from the past 240 hour(s))  Resp Panel by RT-PCR (Flu A&B, Covid) Nasopharyngeal Swab     Status: None   Collection Time: 02/01/21  1:52 AM   Specimen: Nasopharyngeal Swab; Nasopharyngeal(NP) swabs in vial transport medium  Result Value Ref Range Status   SARS Coronavirus 2 by RT  PCR NEGATIVE NEGATIVE Final    Comment: (NOTE) SARS-CoV-2 target nucleic acids are NOT DETECTED.  The SARS-CoV-2 RNA is generally detectable in upper respiratory specimens during the acute phase of infection. The lowest concentration of SARS-CoV-2 viral copies this assay can detect is 138 copies/mL. A negative result does not preclude SARS-Cov-2 infection and should not be used as the sole basis for treatment or other patient management decisions. A negative result may occur with  improper specimen collection/handling, submission of specimen other than nasopharyngeal swab, presence of viral mutation(s) within the areas targeted by this assay, and inadequate number of viral copies(<138 copies/mL). A negative result must be combined with clinical observations, patient history, and epidemiological information. The expected result is Negative.  Fact Sheet for Patients:  BloggerCourse.com  Fact Sheet for Healthcare Providers:  SeriousBroker.it  This test is no t yet approved or cleared by the [place] FDA and  has been authorized for detection and/or diagnosis of SARS-CoV-2 by FDA under an Emergency Use Authorization (EUA). This EUA will remain  in effect (meaning this test can be used) for the duration of the COVID-19 declaration under Section 564(b)(1) of the Act, 21 U.S.C.section 360bbb-3(b)(1), unless the authorization is terminated  or revoked sooner.       Influenza A by PCR NEGATIVE NEGATIVE Final   Influenza B by PCR NEGATIVE NEGATIVE Final    Comment: (NOTE) The Xpert Xpress SARS-CoV-2/FLU/RSV plus assay is intended as an aid in the diagnosis of influenza from Nasopharyngeal swab specimens and should not be used as a sole basis for treatment. Nasal washings and aspirates are unacceptable for Xpert Xpress SARS-CoV-2/FLU/RSV testing.  Fact Sheet for Patients: BloggerCourse.com  Fact Sheet for  Healthcare Providers: SeriousBroker.it  This test is not yet approved or cleared by the [place] FDA and has been authorized for detection and/or diagnosis of SARS-CoV-2 by FDA under an Emergency Use Authorization (EUA). This EUA will remain in effect (meaning this test can be used) for the duration of the COVID-19 declaration under  Section 564(b)(1) of the Act, 21 U.S.C. section 360bbb-3(b)(1), unless the authorization is terminated or revoked.  Performed at [facility], [address] [place], [place] [address]   Respiratory (~20 pathogens) panel by PCR     Status: None   Collection Time: 02/01/21 10:44 AM   Specimen: Nasopharyngeal Swab; Respiratory  Result Value Ref Range Status   Adenovirus NOT DETECTED NOT DETECTED Final   Coronavirus 229E NOT DETECTED NOT DETECTED Final    Comment: (NOTE) The Coronavirus on the Respiratory Panel, DOES NOT test for the novel  Coronavirus (2019 nCoV)    Coronavirus HKU1 NOT DETECTED NOT DETECTED Final   Coronavirus NL63 NOT DETECTED NOT DETECTED Final   Coronavirus OC43 NOT DETECTED NOT DETECTED Final   Metapneumovirus NOT DETECTED NOT DETECTED Final   Rhinovirus / Enterovirus NOT DETECTED NOT DETECTED Final   Influenza A NOT DETECTED NOT DETECTED Final   Influenza B NOT DETECTED NOT DETECTED Final   Parainfluenza Virus 1 NOT DETECTED NOT DETECTED Final   Parainfluenza Virus 2 NOT DETECTED NOT DETECTED Final   Parainfluenza Virus 3 NOT DETECTED NOT DETECTED Final   Parainfluenza Virus 4 NOT DETECTED NOT DETECTED Final   Respiratory Syncytial Virus NOT DETECTED NOT DETECTED Final   Bordetella pertussis NOT DETECTED NOT DETECTED Final   Bordetella Parapertussis NOT DETECTED NOT DETECTED Final   Chlamydophila pneumoniae NOT DETECTED NOT DETECTED Final   Mycoplasma pneumoniae NOT DETECTED NOT DETECTED Final    Comment: Performed at [facility], [address], [place], [place]  [address]     Time coordinating discharge in minutes: 65  SIGNED:   Calvert Cantor, MD  [facility] 02/02/2021, 5:03 PM

## 2021-05-11 ENCOUNTER — Institutional Professional Consult (permissible substitution): Payer: No Typology Code available for payment source | Admitting: Emergency Medicine

## 2021-05-17 ENCOUNTER — Institutional Professional Consult (permissible substitution): Payer: No Typology Code available for payment source | Admitting: Pulmonary Disease

## 2021-06-08 ENCOUNTER — Institutional Professional Consult (permissible substitution): Payer: No Typology Code available for payment source | Admitting: Pulmonary Disease

## 2021-06-08 NOTE — Progress Notes (Deleted)
Subjective:   PATIENT ID: Jesse Powell GENDER: male DOB: 06/09/1965, MRN: [id number]   HPI  No chief complaint on file.   Reason for Visit: New consult for COPD  Mr. 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 Previsously by Dr. Shelle Iron who prescribed him MWF azithromycin. Currently does not have a pulmonologist.  In the last year year he has had multiple ED visits and was hospitalized in April and Dec 2022 for COPD exacerbation.  *** Social History: Quit smoking in 2018   Environmental exposures: ***  I have personally reviewed patient's past medical/family/social history, allergies, current medications.***  Past Medical History:  Diagnosis Date   COPD (chronic obstructive pulmonary disease) (HCC)      No family history on file.   Social History   Occupational History   Not on file  Tobacco Use   Smoking status: Every Day    Packs/day: 0.50    Types: Cigarettes   Smokeless tobacco: Never  Substance and Sexual Activity   Alcohol use: Yes    Alcohol/week: 3.0 standard drinks    Types: 3 Cans of beer per week   Drug use: No   Sexual activity: Not on file    No Known Allergies   Outpatient Medications Prior to Visit  Medication Sig Dispense Refill   albuterol (PROVENTIL HFA;VENTOLIN HFA) 108 (90 BASE) MCG/ACT inhaler Inhale 2 puffs into the lungs every 2 (two) hours as needed for wheezing or shortness of breath (cough). 1 Inhaler 0   azithromycin (ZITHROMAX) 250 MG tablet Take 250 mg by mouth every Monday, Wednesday, and Friday.     cyclobenzaprine (FLEXERIL) 10 MG tablet Take 1 tablet by mouth at bedtime as needed for muscle spasms.     fluticasone (FLONASE) 50 MCG/ACT nasal spray Place 1 spray into both nostrils daily. 9.9 g 2   gabapentin (NEURONTIN) 300 MG capsule Take 300-600 mg by mouth at bedtime.     ibuprofen  (ADVIL,MOTRIN) 800 MG tablet Take 800 mg by mouth every 8 (eight) hours as needed for moderate pain.     mometasone (ASMANEX) 220 MCG/INH inhaler Inhale 2 puffs into the lungs at bedtime.     Tiotropium Bromide-Olodaterol 2.5-2.5 MCG/ACT AERS Inhale 2 puffs into the lungs in the morning.     No facility-administered medications prior to visit.    ROS   Objective:  There were no vitals filed for this visit.    Physical Exam: General: Well-appearing, no acute distress HENT: [place], AT Eyes: EOMI, no scleral icterus Respiratory: Clear to auscultation bilaterally.  No crackles, wheezing or rales Cardiovascular: RRR, -M/R/G, no JVD Extremities:-Edema,-tenderness Neuro: AAO x4, CNII-XII grossly intact Psych: Normal mood, normal affect  Data Reviewed:  Imaging: CTA 07/23/18 - No PE. Bilateral airspace disease in bases. Severe paraseptal emphysema. Severe cystic bronchiectasis. RUL nodule 1.1 x 0.9 cm CXR 02/01/21 - Hyperinflation. Unchanged right basilar nodular changes and perihilar opacities  CT Chest 09/11/20 (report only) - Impression: New bilateral consolidative and nodular pulmonary opacities  with significant interval increase in bilateral mucous plugging  likely reflects a multifocal infectious or inflammatory process  but is nonspecific in the setting of prior lung cancer. A  three-month follow-up noncontrast chest CT is recommended to  ensure resolution.   2. Post surgical changes of left upper lobe wedge resection  without evidence of local recurrence.  Redemonstrated severe  emphysema and bronchiectasis.   PFT: None on file  Labs: CBC    Component Value Date/Time   WBC 10.0 02/02/2021 0533   RBC 4.41 02/02/2021 0533   HGB 13.8 02/02/2021 0533   HCT 40.7 02/02/2021 0533   PLT 386 02/02/2021 0533   MCV 92.3 02/02/2021 0533   MCH 31.3 02/02/2021 0533   MCHC 33.9 02/02/2021 0533   RDW 13.6 02/02/2021 0533   LYMPHSABS 1.6 02/01/2021 0152   MONOABS 0.4 02/01/2021  0152   EOSABS 0.3 02/01/2021 0152   BASOSABS 0.0 02/01/2021 0152   Absolute eos 02/01/21 - 300     Assessment & Plan:   Discussion:  Severe emphysema and cystic bronchiectasis --ORDER pulmonary function tests --ORDER alpha-1-antitrypsin  Stage IA2 NSCLC s/p wedge resection of LUL and LLL 02/2017 --Followed by Hem/onc --Surveillance imaging with no recurrence of cancer on recent CT 04/26/20 --Scheduled for follow-up CT at [facility] in 3 months (June)  Health Maintenance  There is no immunization history on file for this patient. CT Lung Screen - not qualified due to age  No orders of the defined types were placed in this encounter. No orders of the defined types were placed in this encounter.   No follow-ups on file.  I have spent a total time of***-minutes on the day of the appointment reviewing prior documentation, coordinating care and discussing medical diagnosis and plan with the patient/family. Imaging, labs and tests included in this note have been reviewed and interpreted independently by me.  [place], MD [facility] 06/08/2021 9:58 AM  Office Number [phone]

## 2021-06-28 ENCOUNTER — Encounter: Payer: No Typology Code available for payment source | Admitting: Gastroenterology

## 2021-07-21 ENCOUNTER — Encounter (HOSPITAL_COMMUNITY): Payer: Self-pay

## 2021-07-21 ENCOUNTER — Observation Stay (HOSPITAL_COMMUNITY)
Admission: EM | Admit: 2021-07-21 | Discharge: 2021-07-22 | Disposition: A | Discharge status: Home / Self Care | Payer: No Typology Code available for payment source | Attending: Internal Medicine | Admitting: Internal Medicine

## 2021-07-21 ENCOUNTER — Other Ambulatory Visit: Payer: Self-pay

## 2021-07-21 ENCOUNTER — Emergency Department (HOSPITAL_COMMUNITY): Payer: No Typology Code available for payment source

## 2021-07-21 DIAGNOSIS — J9601 Acute respiratory failure with hypoxia: Secondary | ICD-10-CM | POA: Diagnosis not present

## 2021-07-21 DIAGNOSIS — Z87891 Personal history of nicotine dependence: Secondary | ICD-10-CM | POA: Insufficient documentation

## 2021-07-21 DIAGNOSIS — J449 Chronic obstructive pulmonary disease, unspecified: Secondary | ICD-10-CM | POA: Diagnosis present

## 2021-07-21 DIAGNOSIS — J441 Chronic obstructive pulmonary disease with (acute) exacerbation: Principal | ICD-10-CM | POA: Diagnosis present

## 2021-07-21 DIAGNOSIS — Z79899 Other long term (current) drug therapy: Secondary | ICD-10-CM | POA: Diagnosis not present

## 2021-07-21 DIAGNOSIS — Z85118 Personal history of other malignant neoplasm of bronchus and lung: Secondary | ICD-10-CM | POA: Insufficient documentation

## 2021-07-21 DIAGNOSIS — R0602 Shortness of breath: Secondary | ICD-10-CM | POA: Diagnosis present

## 2021-07-21 LAB — BASIC METABOLIC PANEL
Anion gap: 9 (ref 5–15)
BUN: 18 mg/dL (ref 6–20)
CO2: 30 mmol/L (ref 22–32)
Calcium: 9.6 mg/dL (ref 8.9–10.3)
Chloride: 96 mmol/L — ABNORMAL LOW (ref 98–111)
Creatinine, Ser: 1.05 mg/dL (ref 0.61–1.24)
GFR, Estimated: >60 mL/min (ref >60)
Glucose, Bld: 111 mg/dL — ABNORMAL HIGH (ref 70–99)
Potassium: 4.4 mmol/L (ref 3.5–5.1)
Sodium: 135 mmol/L (ref 135–145)

## 2021-07-21 LAB — TROPONIN I (HIGH SENSITIVITY)
Troponin I (High Sensitivity): 5 ng/L (ref <18)
Troponin I (High Sensitivity): 4 ng/L (ref <18)

## 2021-07-21 LAB — BLOOD GAS, VENOUS
Acid-Base Excess: 7.4 mmol/L — ABNORMAL HIGH (ref 0.0–2.0)
Bicarbonate: 35.6 mmol/L — ABNORMAL HIGH (ref 20.0–28.0)
O2 Saturation: 97.6 %
Patient temperature: 37.1
pCO2, Ven: 66 mmHg — ABNORMAL HIGH (ref 44–60)
pH, Ven: 7.34 (ref 7.25–7.43)
pO2, Ven: 82 mmHg — ABNORMAL HIGH (ref 32–45)

## 2021-07-21 LAB — BRAIN NATRIURETIC PEPTIDE: B Natriuretic Peptide: 19.1 pg/mL (ref 0.0–100.0)

## 2021-07-21 MED ORDER — IPRATROPIUM-ALBUTEROL 0.5-2.5 (3) MG/3ML IN SOLN
3.0000 mL | Freq: Once | RESPIRATORY_TRACT | Status: AC
  Administered 2021-07-21: 3 mL via RESPIRATORY_TRACT
  Filled 2021-07-21: qty 3

## 2021-07-21 MED ORDER — METHYLPREDNISOLONE SODIUM SUCC 125 MG IJ SOLR
125.0000 mg | Freq: Once | INTRAMUSCULAR | Status: AC
  Administered 2021-07-21: 125 mg via INTRAVENOUS
  Filled 2021-07-21: qty 2

## 2021-07-21 MED ORDER — SODIUM CHLORIDE 0.9 % IV SOLN
1.0000 g | INTRAVENOUS | Status: DC
  Administered 2021-07-21: 1 g via INTRAVENOUS
  Filled 2021-07-21: qty 10

## 2021-07-21 NOTE — ED Provider Triage Note (Signed)
Emergency Medicine Provider Triage Evaluation Note  Marv Bockus , a 56 y.o. male  was evaluated in triage.  Pt complains of shortness of breath onset 2-3 days.  Has a history of COPD notes that he has ran out of his prednisone.  Supposed wear oxygen at home however has not been delivered yet.  Has chest pain.  Notes that his symptoms have resolved with oxygen that we provided in the emergency department today.  No meds tried prior to arrival.  Denies nausea, vomiting.  Review of Systems  Positive: As per HPI above Negative:   Physical Exam  BP (!) 138/96 (BP Location: Left Arm)   Pulse (!) 103   Temp 98.4 F (36.9 C) (Oral)   Resp (!) 25   Ht 5\' 9"  (1.753 m)   Wt 65.8 kg   SpO2 (!) 88%   BMI 21.41 kg/m  Gen:   Awake, no distress nasal cannula in place Resp:  Normal effort, distant lung sounds MSK:   Moves extremities without difficulty  Other:    Medical Decision Making  Medically screening exam initiated at 8:20 PM.  Appropriate orders placed.  Yiovanni Gartman was informed that the remainder of the evaluation will be completed by another provider, this initial triage assessment does not replace that evaluation, and the importance of remaining in the ED until their evaluation is complete.  8:20 PM - Discussed with RN that patient is in need of a room immediately. RN aware and working on room placement.     Rossy Virag A, PA-C 07/21/21 2023

## 2021-07-21 NOTE — ED Triage Notes (Signed)
Ambulatory to ED with c/o COPD exacerbation x 4 days. States he's been out of his prednisone for 3 days. 88% on RA in triage - placed on 2L.   States he's qualified for home oxygen but does not have oxygen set up at home yet.

## 2021-07-21 NOTE — ED Provider Notes (Signed)
[facility]-EMERGENCY DEPT Provider Note   CSN: [id number] Arrival date & time: 07/21/21  1952     History  Chief Complaint  Patient presents with   Shortness of Breath    Jesse Powell is a 56 y.o. male.  HPI Patient has history of COPD and bronchiectasis as well as lung adenocarcinoma status post resection in 2019, currently in remission.  He reports a has been having increasing difficulty breathing.  He reports he does have a productive cough.  He reports he was told to take additional doses of prednisone last week and now he has run out.  He denies fevers or chills.  Patient reports he does get chest pain off and on.  He reports he had fairly bad chest pain earlier today but he has none currently.  He denies any swelling in the legs or pain in the calves.  He reports he is supposed to be getting evaluated for home oxygen but does not currently have any.    Home Medications Prior to Admission medications   Medication Sig Start Date End Date Taking? Authorizing Provider  albuterol (PROVENTIL HFA;VENTOLIN HFA) 108 (90 BASE) MCG/ACT inhaler Inhale 2 puffs into the lungs every 2 (two) hours as needed for wheezing or shortness of breath (cough). 02/07/14   Street, [place], PA-C  azithromycin (ZITHROMAX) 250 MG tablet Take 250 mg by mouth every Monday, Wednesday, and Friday. 10/06/19   [provider]  cyclobenzaprine (FLEXERIL) 10 MG tablet Take 1 tablet by mouth at bedtime as needed for muscle spasms. 08/08/20   [provider]  fluticasone (FLONASE) 50 MCG/ACT nasal spray Place 1 spray into both nostrils daily. 02/02/21 02/02/22  Calvert Cantor, MD  gabapentin (NEURONTIN) 300 MG capsule Take 300-600 mg by mouth at bedtime.    [provider]  ibuprofen (ADVIL,MOTRIN) 800 MG tablet Take 800 mg by mouth every 8 (eight) hours as needed for moderate pain.    [provider]  mometasone [facility]) 220 MCG/INH inhaler Inhale 2 puffs into  the lungs at bedtime. 09/06/19   [provider]  Tiotropium Bromide-Olodaterol 2.5-2.5 MCG/ACT AERS Inhale 2 puffs into the lungs in the morning. 09/06/19   [provider]      Allergies    Patient has no known allergies.    Review of Systems   Review of Systems 10 systems reviewed negative except as per HPI Physical Exam Updated Vital Signs BP (!) 142/107   Pulse 89   Temp 98.4 F (36.9 C) (Oral)   Resp 19   Ht 5\' 9"  (1.753 m)   Wt 65.8 kg   SpO2 98%   BMI 21.41 kg/m  Physical Exam Constitutional:      Comments: Patient is alert.  Intermittent wet cough.  Mild to moderate increased work of breathing at rest.  HENT:     Mouth/Throat:     Pharynx: Oropharynx is clear.  Eyes:     Extraocular Movements: Extraocular movements intact.  Cardiovascular:     Rate and Rhythm: Normal rate and regular rhythm.  Pulmonary:     Comments: Patient is diffuse wheeze.  Crackles are present in the midlung fields.  Breath sounds are coarse.  Soft at the bases. Abdominal:     General: There is no distension.     Palpations: Abdomen is soft.     Tenderness: There is no abdominal tenderness. There is no guarding.  Musculoskeletal:        General: Normal range of motion.  Comments: No peripheral edema.  Calves are soft and nontender.  Skin:    General: Skin is warm and dry.  Neurological:     General: No focal deficit present.     Mental Status: He is oriented to person, place, and time.     Coordination: Coordination normal.    ED Results / Procedures / Treatments   Labs (all labs ordered are listed, but only abnormal results are displayed) Labs Reviewed  BASIC METABOLIC PANEL - Abnormal; Notable for the following components:      Result Value   Chloride 96 (*)    Glucose, Bld 111 (*)    All other components within normal limits  BLOOD GAS, VENOUS - Abnormal; Notable for the following components:   pCO2, Ven 66 (*)    pO2, Ven 82 (*)    Bicarbonate 35.6 (*)     Acid-Base Excess 7.4 (*)    All other components within normal limits  BRAIN NATRIURETIC PEPTIDE  TROPONIN I (HIGH SENSITIVITY)  TROPONIN I (HIGH SENSITIVITY)    EKG EKG Interpretation  Date/Time:  Saturday July 21 2021 20:35:47 EDT Ventricular Rate:  100 PR Interval:  125 QRS Duration: 90 QT Interval:  351 QTC Calculation: 453 R Axis:   90 Text Interpretation: Sinus tachycardia Right atrial enlargement Borderline right axis deviation agree, no sig change from previous Confirmed by Arby BarrettePfeiffer, Cora Brierley [phone]([id number]) on 07/21/2021 9:38:33 PM  Radiology DG Chest 2 View  Result Date: 07/21/2021 CLINICAL DATA:  Shortness of breath, COPD exacerbation, hypoxia EXAM: CHEST - 2 VIEW COMPARISON:  02/01/2021 FINDINGS: normal heart size and vascularity. Similar hyperinflation with scattered bilateral pleuroparenchymal scarring most pronounced in the left upper lobe and the right lower lung. No definite superimposed pneumonia, edema, collapse or consolidation. No large effusion or pneumothorax. Trachea midline. No acute osseous finding. IMPRESSION: Stable chronic severe emphysema pattern. No superimposed acute process by plain radiography. Electronically Signed   By: Judie PetitM.  Shick M.D.   On: 07/21/2021 20:42    Procedures Procedures    Medications Ordered in ED Medications  methylPREDNISolone sodium succinate (SOLU-MEDROL) 125 mg/2 mL injection 125 mg (125 mg Intravenous Given 07/21/21 2202)  ipratropium-albuterol (DUONEB) 0.5-2.5 (3) MG/3ML nebulizer solution 3 mL (3 mLs Nebulization Given 07/21/21 2202)    ED Course/ Medical Decision Making/ A&P                           Medical Decision Making Amount and/or Complexity of Data Reviewed Labs: ordered.  Risk Prescription drug management. Decision regarding hospitalization.   At baseline patient has severe COPD with bronchiectasis.  He describes worsening of his condition over the past 3 days after running out of prednisone.  Patient reports he was  told to increase his dose last week to 4 tablets a week and thus he ran out of his prescription early in cannot apparently get [facility] now.  He also reports that his oxygen saturation at home was down in the 80s.  He reports he does not yet have home oxygen and is waiting for approval.  Patient is treated with DuoNeb and Solu-Medrol.  On reassessment he continues to have extensive wheeze and moderate increased work of breathing.  Chest x-ray reviewed by radiology shows emphysematous changes but no acute findings.  EKG unchanged as reviewed by myself.  Troponin negative.  Venous gas shows PCO2 of 60s.  At this time with patient remaining symptomatic after treatment and having  severe baseline COPD plan for admission for COPD exacerbation.  Consult: Dr. Maisie Fus for admission [facility] hospital[facility].        Final Clinical Impression(s) / ED Diagnoses Final diagnoses:  COPD exacerbation [facility])    Rx / DC Orders ED Discharge Orders     None         Arby Barrette, MD 07/21/21 2337

## 2021-07-22 DIAGNOSIS — J449 Chronic obstructive pulmonary disease, unspecified: Secondary | ICD-10-CM | POA: Diagnosis present

## 2021-07-22 DIAGNOSIS — J441 Chronic obstructive pulmonary disease with (acute) exacerbation: Secondary | ICD-10-CM

## 2021-07-22 LAB — COMPREHENSIVE METABOLIC PANEL
ALT: 14 U/L (ref 0–44)
AST: 17 U/L (ref 15–41)
Albumin: 3.5 g/dL (ref 3.5–5.0)
Alkaline Phosphatase: 56 U/L (ref 38–126)
Anion gap: 7 (ref 5–15)
BUN: 17 mg/dL (ref 6–20)
CO2: 31 mmol/L (ref 22–32)
Calcium: 9.5 mg/dL (ref 8.9–10.3)
Chloride: 99 mmol/L (ref 98–111)
Creatinine, Ser: 0.89 mg/dL (ref 0.61–1.24)
GFR, Estimated: >60 mL/min (ref >60)
Glucose, Bld: 113 mg/dL — ABNORMAL HIGH (ref 70–99)
Potassium: 4.4 mmol/L (ref 3.5–5.1)
Sodium: 137 mmol/L (ref 135–145)
Total Bilirubin: 0.4 mg/dL (ref 0.3–1.2)
Total Protein: 7.9 g/dL (ref 6.5–8.1)

## 2021-07-22 LAB — CBC
HCT: 43.4 % (ref 39.0–52.0)
Hemoglobin: 14.6 g/dL (ref 13.0–17.0)
MCH: 31.7 pg (ref 26.0–34.0)
MCHC: 33.6 g/dL (ref 30.0–36.0)
MCV: 94.3 fL (ref 80.0–100.0)
Platelets: 481 10*3/uL — ABNORMAL HIGH (ref 150–400)
RBC: 4.6 MIL/uL (ref 4.22–5.81)
RDW: 12.9 % (ref 11.5–15.5)
WBC: 10.2 10*3/uL (ref 4.0–10.5)
nRBC: 0 % (ref 0.0–0.2)

## 2021-07-22 LAB — RESPIRATORY PANEL BY PCR

## 2021-07-22 LAB — HEMOGLOBIN A1C
Hgb A1c MFr Bld: 6 % — ABNORMAL HIGH (ref 4.8–5.6)
Mean Plasma Glucose: 125.5 mg/dL

## 2021-07-22 LAB — EXPECTORATED SPUTUM ASSESSMENT W GRAM STAIN, RFLX TO RESP C

## 2021-07-22 LAB — TSH: TSH: 0.578 u[IU]/mL (ref 0.350–4.500)

## 2021-07-22 LAB — HIV ANTIBODY (ROUTINE TESTING W REFLEX): HIV Screen 4th Generation wRfx: NONREACTIVE

## 2021-07-22 MED ORDER — HEPARIN SODIUM (PORCINE) 5000 UNIT/ML IJ SOLN
5000.0000 [IU] | Freq: Three times a day (TID) | INTRAMUSCULAR | Status: DC
  Administered 2021-07-22 (×2): 5000 [IU] via SUBCUTANEOUS
  Filled 2021-07-22 (×2): qty 1

## 2021-07-22 MED ORDER — ALBUTEROL SULFATE (2.5 MG/3ML) 0.083% IN NEBU: 2.5000 mg | INHALATION_SOLUTION | RESPIRATORY_TRACT | Status: DC | PRN

## 2021-07-22 MED ORDER — ONDANSETRON HCL 4 MG/2ML IJ SOLN: 4.0000 mg | Freq: Four times a day (QID) | INTRAMUSCULAR | Status: DC | PRN

## 2021-07-22 MED ORDER — METHYLPREDNISOLONE SODIUM SUCC 40 MG IJ SOLR
40.0000 mg | Freq: Two times a day (BID) | INTRAMUSCULAR | Status: DC
  Administered 2021-07-22: 40 mg via INTRAVENOUS
  Filled 2021-07-22: qty 1

## 2021-07-22 MED ORDER — ACETAMINOPHEN 325 MG PO TABS: 650.0000 mg | ORAL_TABLET | Freq: Four times a day (QID) | ORAL | Status: DC | PRN

## 2021-07-22 MED ORDER — BUDESONIDE 0.5 MG/2ML IN SUSP
0.5000 mg | Freq: Two times a day (BID) | RESPIRATORY_TRACT | Status: DC
  Administered 2021-07-22: 0.5 mg via RESPIRATORY_TRACT
  Filled 2021-07-22: qty 2

## 2021-07-22 MED ORDER — DOXYCYCLINE MONOHYDRATE 100 MG PO TABS: 100.0000 mg | ORAL_TABLET | Freq: Two times a day (BID) | ORAL | 0 refills | Status: AC

## 2021-07-22 MED ORDER — PREDNISONE 10 MG PO TABS: ORAL_TABLET | ORAL | 0 refills | Status: DC

## 2021-07-22 MED ORDER — PREDNISONE 20 MG PO TABS: 40.0000 mg | ORAL_TABLET | Freq: Every day | ORAL | Status: DC

## 2021-07-22 MED ORDER — ACETAMINOPHEN 650 MG RE SUPP: 650.0000 mg | Freq: Four times a day (QID) | RECTAL | Status: DC | PRN

## 2021-07-22 MED ORDER — ONDANSETRON HCL 4 MG PO TABS: 4.0000 mg | ORAL_TABLET | Freq: Four times a day (QID) | ORAL | Status: DC | PRN

## 2021-07-22 MED ORDER — SODIUM CHLORIDE 0.9 % IV SOLN
2.0000 g | Freq: Three times a day (TID) | INTRAVENOUS | Status: DC
  Administered 2021-07-22 (×2): 2 g via INTRAVENOUS
  Filled 2021-07-22 (×3): qty 12.5

## 2021-07-22 MED ORDER — TIOTROPIUM BROMIDE-OLODATEROL 2.5-2.5 MCG/ACT IN AERS: 2.0000 | INHALATION_SPRAY | Freq: Every morning | RESPIRATORY_TRACT | 0 refills | Status: DC

## 2021-07-22 MED ORDER — CYCLOBENZAPRINE HCL 10 MG PO TABS: 10.0000 mg | ORAL_TABLET | Freq: Every evening | ORAL | Status: DC | PRN

## 2021-07-22 MED ORDER — GABAPENTIN 300 MG PO CAPS: 300.0000 mg | ORAL_CAPSULE | Freq: Every day | ORAL | Status: DC

## 2021-07-22 MED ORDER — UMECLIDINIUM-VILANTEROL 62.5-25 MCG/ACT IN AEPB: 1.0000 | INHALATION_SPRAY | Freq: Every day | RESPIRATORY_TRACT | Status: DC

## 2021-07-22 MED ORDER — SODIUM CHLORIDE 0.9 % IV SOLN
500.0000 mg | INTRAVENOUS | Status: DC
  Administered 2021-07-22: 500 mg via INTRAVENOUS
  Filled 2021-07-22: qty 5

## 2021-07-22 MED ORDER — UMECLIDINIUM BROMIDE 62.5 MCG/ACT IN AEPB
1.0000 | INHALATION_SPRAY | Freq: Every day | RESPIRATORY_TRACT | Status: DC
  Administered 2021-07-22: 1 via RESPIRATORY_TRACT
  Filled 2021-07-22: qty 7

## 2021-07-22 MED ORDER — ARFORMOTEROL TARTRATE 15 MCG/2ML IN NEBU
15.0000 ug | INHALATION_SOLUTION | Freq: Two times a day (BID) | RESPIRATORY_TRACT | Status: DC
  Administered 2021-07-22: 15 ug via RESPIRATORY_TRACT
  Filled 2021-07-22: qty 2

## 2021-07-22 NOTE — Discharge Summary (Signed)
Discharge Summary  Jesse Powell [id number] DOB: [birth year]  PCP: [facility]  Admit date: 07/21/2021 Discharge date: 07/22/2021  Time spent:  49mins  Recommendations for Outpatient Follow-up:  F/u with PCP within a week  for hospital discharge follow up, repeat cbc/bmp at follow up F/u with [facility] pulmonology as scheduled next week    Discharge Diagnoses:  Active Hospital Problems   Diagnosis Date Noted   Acute exacerbation of chronic obstructive pulmonary disease (COPD) ([place]) 07/21/2021   COPD (chronic obstructive pulmonary disease) ([place]) 07/22/2021    Resolved Hospital Problems  No resolved problems to display.    Discharge Condition: stable  Diet recommendation: regular diet   Filed Weights   07/21/21 2001 07/22/21 0018  Weight: 65.8 kg 64.2 kg    History of present illness:   Jesse Powell is a 56 y.o. male with medical history significant of  lung ca in remission x resection 2019,bronchiectasis , COPD not on home O2, on chronic steroids, tobacco abuse in remission x 4 months who presents to ED with worsening sob. Per patient sob has been progressive over the last 1 week but more so over the last 3-4 days.  Patient states that once he noticed his increase sob and cough he called his primary who recommended he increase his steroid dosing to 40mg  from his baseline 10mg .  He notes he initially started to feel improved but ran out of his steroids. He states over the last 3-4 days w/o his prednisone his sob got worse and cough was noted to be more productive. He notes cough productive of yellow sputum. He states that he also noted difficulty sleeping due to his increase difficulty with his breathing. Due to progression of his symptoms he presented to ED. He notes no associated chest pain , fever/ chills/ n/v/d/ abdominal pain, or dysuria. He currently notes he feels improved since treatment in ED. He notes he is finally able to rest.    ED Course:  In ed on  evaluation patient was initially noted to be hypoxic to 88 % on ra with increase wob with rr of 25, and Hr of 103, bp stable at 138/96. Patient placed on  4L [place] with improvement to 97% s/p  nebs and steroids. CXR : NAD Labs: Na 135, K 4.4 , cr 1.05 Ce 5, bnp 19.1 Vbg: 7/34/66 EKG: sinus tachycardia  RAE,  Tx: solumedrol 125, douneb, ceftriaxone  Hospital Course:  Principal Problem:   Acute exacerbation of chronic obstructive pulmonary disease (COPD) ([place]) Active Problems:   COPD (chronic obstructive pulmonary disease) ([facility])   COPD exacerbation/acute hypoxic respiratory failure -Improved with IV steroid, IV antibiotics -Chest x-ray no acute findings -Labs stable -he reports now he is at baseline, he desires to go home, he has pulmonology follow-up next week -He is discharged home prednisone taper and doxycycline  Hx of Tobacco abuse -per patient in remission   Hx of Lung adenocarcinoma s/p resection 2019 -in remission    Discharge Exam: BP (!) 149/99 (BP Location: Right Arm)   Pulse 97   Temp (!) 97.5 F (36.4 C) (Oral)   Resp 14   Ht 5\' 9"  (1.753 m)   Wt 64.2 kg   SpO2 95%   BMI 20.90 kg/m   General: NAD Cardiovascular: RRR Respiratory: Diminished, no wheezing, normal respiratory effort, no hypoxia     Discharge Instructions     Diet general   Complete by: As directed    Increase activity slowly   Complete by: As  directed       Allergies as of 07/22/2021   No Known Allergies      Medication List     TAKE these medications    albuterol 108 (90 Base) MCG/ACT inhaler Commonly known as: VENTOLIN HFA Inhale 2 puffs into the lungs every 2 (two) hours as needed for wheezing or shortness of breath (cough).   albuterol (2.5 MG/3ML) 0.083% nebulizer solution Commonly known as: PROVENTIL Take 2.5 mg by nebulization every 6 (six) hours as needed for wheezing or shortness of breath.   azithromycin 250 MG tablet Commonly known as: ZITHROMAX Take 250 mg by  mouth every Monday, Wednesday, and Friday.   cyclobenzaprine 10 MG tablet Commonly known as: FLEXERIL Take 1 tablet by mouth at bedtime as needed for muscle spasms.   doxycycline 100 MG tablet Commonly known as: ADOXA Take 1 tablet (100 mg total) by mouth 2 (two) times daily for 5 days.   fluticasone 50 MCG/ACT nasal spray Commonly known as: Flonase Place 1 spray into both nostrils daily.   gabapentin 300 MG capsule Commonly known as: NEURONTIN Take 300 mg by mouth at bedtime.   ibuprofen 800 MG tablet Commonly known as: ADVIL Take 800 mg by mouth 2 (two) times daily.   mometasone 220 MCG/INH inhaler Commonly known as: ASMANEX Inhale 2 puffs into the lungs at bedtime.   predniSONE 10 MG tablet Commonly known as: DELTASONE Please take prednisone daily with breakfast  Take 40mg  on 6/12, then 30mg  on 6/13, 20mg  on 6/14, 10mg  on 6/15, stay on 10mg  daily with breakfast , [facility] dose [facility]  [facility] appointment with [facility] pulmonologist is on 6/15   Tiotropium Bromide-Olodaterol 2.5-2.5 MCG/ACT Aers Inhale 2 puffs into the lungs in the morning.       No Known Allergies  Follow-up Information     [facility] on 07/26/2021.   [facility] per [facility] pulmonology Contact information: [place] [address] [phone]         [facility] Follow up.                   The results of significant diagnostics from this hospitalization (including imaging, microbiology, ancillary and laboratory) are listed below for reference.    Significant Diagnostic Studies: DG Chest 2 View  Result Date: 07/21/2021 CLINICAL DATA:  Shortness of breath, COPD exacerbation, hypoxia EXAM: CHEST - 2 VIEW COMPARISON:  02/01/2021 FINDINGS: normal heart size and vascularity. Similar hyperinflation with scattered bilateral pleuroparenchymal scarring most pronounced in  the left upper lobe and the right lower lung. No definite superimposed pneumonia, edema, collapse or consolidation. No large effusion or pneumothorax. Trachea midline. No acute osseous finding. IMPRESSION: Stable chronic severe emphysema pattern. No superimposed acute process by plain radiography. Electronically Signed   By: Jerilynn Mages.  Shick M.D.   On: 07/21/2021 20:42    Microbiology: Recent Results (from the past 240 hour(s))  Respiratory (~20 pathogens) panel by PCR     Status: None   Collection Time: 07/22/21  1:38 AM   Specimen: Nasopharyngeal Swab; Respiratory  Result Value Ref Range Status   Adenovirus NOT DETECTED NOT DETECTED Final   Coronavirus 229E NOT DETECTED NOT DETECTED Final    Comment: (NOTE) The Coronavirus on the Respiratory Panel, DOES NOT test for the novel  Coronavirus (2019 nCoV)    Coronavirus HKU1 NOT DETECTED NOT DETECTED Final   Coronavirus NL63 NOT DETECTED NOT  DETECTED Final   Coronavirus OC43 NOT DETECTED NOT DETECTED Final   Metapneumovirus NOT DETECTED NOT DETECTED Final   Rhinovirus / Enterovirus NOT DETECTED NOT DETECTED Final   Influenza A NOT DETECTED NOT DETECTED Final   Influenza B NOT DETECTED NOT DETECTED Final   Parainfluenza Virus 1 NOT DETECTED NOT DETECTED Final   Parainfluenza Virus 2 NOT DETECTED NOT DETECTED Final   Parainfluenza Virus 3 NOT DETECTED NOT DETECTED Final   Parainfluenza Virus 4 NOT DETECTED NOT DETECTED Final   Respiratory Syncytial Virus NOT DETECTED NOT DETECTED Final   Bordetella pertussis NOT DETECTED NOT DETECTED Final   Bordetella Parapertussis NOT DETECTED NOT DETECTED Final   Chlamydophila pneumoniae NOT DETECTED NOT DETECTED Final   Mycoplasma pneumoniae NOT DETECTED NOT DETECTED Final    Comment: Performed at [facility], [place] [address], [place], [place] [address]  Expectorated Sputum Assessment w Gram Stain, Rflx to Resp Cult     Status: None   Collection Time: 07/22/21  1:02 PM   Specimen: Sputum  Result Value  Ref Range Status   Specimen Description SPUTUM  Final   Special Requests NONE  Final   Sputum evaluation   Final    Sputum specimen not acceptable for testing.  Please recollect.   Performed at [facility], [place] [address], [place], [place] [address]    Report Status 07/22/2021 FINAL  Final     Labs: Basic Metabolic Panel: Recent Labs  Lab 07/21/21 2035 07/22/21 0055  NA 135 137  K 4.4 4.4  CL 96* 99  CO2 30 31  GLUCOSE 111* 113*  BUN 18 17  CREATININE 1.05 0.89  CALCIUM 9.6 9.5   Liver Function Tests: Recent Labs  Lab 07/22/21 0055  AST 17  ALT 14  ALKPHOS 56  BILITOT 0.4  PROT 7.9  ALBUMIN 3.5   No results for input(s): "LIPASE", "AMYLASE" in the last 168 hours. No results for input(s): "AMMONIA" in the last 168 hours. CBC: Recent Labs  Lab 07/22/21 0055  WBC 10.2  HGB 14.6  HCT 43.4  MCV 94.3  PLT 481*   Cardiac Enzymes: No results for input(s): "CKTOTAL", "CKMB", "CKMBINDEX", "TROPONINI" in the last 168 hours. BNP: BNP (last 3 results) Recent Labs    02/01/21 0152 07/21/21 2035  BNP 11.6 19.1    ProBNP (last 3 results) No results for input(s): "PROBNP" in the last 8760 hours.  CBG: No results for input(s): "GLUCAP" in the last 168 hours.  FURTHER DISCHARGE INSTRUCTIONS:   Get Medicines reviewed and adjusted: Please take all your medications with you for your next visit with your Primary MD   Laboratory/radiological data: Please request your Primary MD to go over all hospital tests and procedure/radiological results at the follow up, please ask your Primary MD to get all Hospital records sent to his/her office.   In some cases, they will be blood work, cultures and biopsy results pending at the time of your discharge. Please request that your primary care M.D. goes through all the records of your hospital data and follows up on these results.   Also Note the following: If you experience worsening of your admission  symptoms, develop shortness of breath, life threatening emergency, suicidal or homicidal thoughts you must seek medical attention immediately by calling 911 or calling your MD immediately  if symptoms less severe.   You must read complete instructions/literature along with all the possible adverse reactions/side effects for all the Medicines you take  and that have been prescribed to you. Take any new Medicines after you have completely understood and accpet all the possible adverse reactions/side effects.    Do not drive when taking Pain medications or sleeping medications (Benzodaizepines)   Do not take more than prescribed Pain, Sleep and Anxiety Medications. It is not advisable to combine anxiety,sleep and pain medications without talking with your primary care practitioner   Special Instructions: If you have smoked or chewed Tobacco  in the last 2 yrs please stop smoking, stop any regular Alcohol  and or any Recreational drug use.   Wear Seat belts while driving.   Please note: You were cared for by a hospitalist during your hospital stay. Once you are discharged, your primary care physician will handle any further medical issues. Please note that NO REFILLS for any discharge medications will be authorized once you are discharged, as it is imperative that you return to your primary care physician (or establish a relationship with a primary care physician if you do not have one) for your post hospital discharge needs so that they can reassess your need for medications and monitor your lab values.     Signed:  Florencia Reasons MD, PhD, FACP  [facility] 07/22/2021, 7:31 PM

## 2021-07-22 NOTE — TOC CM/SW Note (Signed)
  Transition of Care [facility]) Screening Note   Patient Details  Name: Jesse Powell Date of Birth: 1965/11/04   Transition of Care [facility]) CM/SW Contact:    Darleene Cleaver, LCSW Phone Number: 07/22/2021, 2:55 PM    Transition of Care Department [facility]) has reviewed patient and no TOC needs have been identified at this time. We will continue to monitor patient advancement through interdisciplinary progression rounds. If new patient transition needs arise, please place a TOC consult.

## 2021-07-22 NOTE — H&P (Addendum)
History and Physical    Jesse Powell MWU:[id number] DOB: Dec 04, 1965 DOA: 07/21/2021  PCP: [facility]  Patient coming from: home  I have personally briefly reviewed patient's old medical records in [facility]  Chief Complaint: sob x1 week  HPI: Jesse Powell is a 56 y.o. male with medical history significant of  lung ca in remission x resection 2019,bronchiectasis , COPD not on home O2, on chronic steroids, tobacco abuse in remission x 4 months who presents to ED with worsening sob. Per patient sob has been progressive over the last 1 week but more so over the last 3-4 days.  Patient states that once he noticed his increase sob and cough he called his primary who recommended he increase his steroid dosing to 40mg  from his baseline 10mg .  He notes he initially started to feel improved but ran out of his steroids. He states over the last 3-4 days w/o his prednisone his sob got worse and cough was noted to be more productive. He notes cough productive of yellow sputum. He states that he also noted difficulty sleeping due to his increase difficulty with his breathing. Due to progression of his symptoms he presented to ED. He notes no associated chest pain , fever/ chills/ n/v/d/ abdominal pain, or dysuria. He currently notes he feels improved since treatment in ED. He notes he is finally able to rest.   ED Course:  In ed on evaluation patient was initially noted to be hypoxic to 88 % on ra with increase wob with rr of 25, and Hr of 103, bp stable at 138/96. Patient placed on  4L [place] with improvement to 97% s/p  nebs and steroids. CXR : NAD Labs: Na 135, K 4.4 , cr 1.05 Ce 5, bnp 19.1 Vbg: 7/34/66 EKG: sinus tachycardia  RAE,  Tx: solumedrol 125, douneb, ceftriaxone  Review of Systems: As per HPI otherwise 10 point review of systems negative.   Past Medical History:  Diagnosis Date   COPD (chronic obstructive pulmonary disease) (HCC)     Past Surgical History:  Procedure  Laterality Date   LUNG SURGERY  2019     reports that he has quit smoking. His smoking use included cigarettes. He smoked an average of .5 packs per day. He has never used smokeless tobacco. He reports current alcohol use of about 3.0 standard drinks of alcohol per week. He reports that he does not use drugs.  No Known Allergies  History reviewed. No pertinent family history.  Prior to Admission medications   Medication Sig Start Date End Date Taking? Authorizing Provider  albuterol (PROVENTIL HFA;VENTOLIN HFA) 108 (90 BASE) MCG/ACT inhaler Inhale 2 puffs into the lungs every 2 (two) hours as needed for wheezing or shortness of breath (cough). 02/07/14   Street, [place], PA-C  azithromycin (ZITHROMAX) 250 MG tablet Take 250 mg by mouth every Monday, Wednesday, and Friday. 10/06/19   [provider]  cyclobenzaprine (FLEXERIL) 10 MG tablet Take 1 tablet by mouth at bedtime as needed for muscle spasms. 08/08/20   [provider]  fluticasone (FLONASE) 50 MCG/ACT nasal spray Place 1 spray into both nostrils daily. 02/02/21 02/02/22  02/04/21, MD  gabapentin (NEURONTIN) 300 MG capsule Take 300-600 mg by mouth at bedtime.    [provider]  ibuprofen (ADVIL,MOTRIN) 800 MG tablet Take 800 mg by mouth every 8 (eight) hours as needed for moderate pain.    [provider]  mometasone [facility]) 220 MCG/INH inhaler Inhale 2 puffs into  the lungs at bedtime. 09/06/19   [provider]  Tiotropium Bromide-Olodaterol 2.5-2.5 MCG/ACT AERS Inhale 2 puffs into the lungs in the morning. 09/06/19   [provider]    Physical Exam: Vitals:   07/21/21 2300 07/21/21 2330 07/21/21 2344 07/21/21 2345  BP: (!) 132/96 140/76    Pulse: 89 84    Resp: 16 15    Temp:    98.2 F (36.8 C)  TempSrc:    Oral  SpO2: 90% 92% 96%   Weight:      Height:         Vitals:   07/21/21 2300 07/21/21 2330 07/21/21 2344 07/21/21 2345  BP: (!) 132/96 140/76     Pulse: 89 84    Resp: 16 15    Temp:    98.2 F (36.8 C)  TempSrc:    Oral  SpO2: 90% 92% 96%   Weight:      Height:      Constitutional: NAD, calm, comfortable no increase wob Eyes: PERRL, lids and conjunctivae normal ENMT: Mucous membranes are moist. Posterior pharynx clear of any exudate or lesions.Normal dentition.  Neck: normal, supple, no masses, no thyromegaly Respiratory:  +rhonchi bilaterally, faint wheezing,+ crackles. Normal respiratory effort. No accessory muscle use.  Cardiovascular: Regular rate and rhythm, no murmurs / rubs / gallops. No extremity edema. 2+ pedal pulses.  Abdomen: no tenderness, no masses palpated. No hepatosplenomegaly. Bowel sounds positive.  Musculoskeletal: no clubbing / cyanosis. No joint deformity upper and lower extremities. Good ROM, no contractures. Normal muscle tone.  Skin: no rashes, lesions, ulcers. No induration Neurologic: CN 2-12 grossly intact. Sensation intact, Strength 5/5 in all 4.  Psychiatric: Normal judgment and insight. Alert and oriented x 3. Normal mood.    Labs on Admission: I have personally reviewed following labs and imaging studies  CBC: No results for input(s): "WBC", "NEUTROABS", "HGB", "HCT", "MCV", "PLT" in the last 168 hours. Basic Metabolic Panel: Recent Labs  Lab 07/21/21 2035  NA 135  K 4.4  CL 96*  CO2 30  GLUCOSE 111*  BUN 18  CREATININE 1.05  CALCIUM 9.6   GFR: Estimated Creatinine Clearance: 75.7 mL/min (by C-G formula based on SCr of 1.05 mg/dL). Liver Function Tests: No results for input(s): "AST", "ALT", "ALKPHOS", "BILITOT", "PROT", "ALBUMIN" in the last 168 hours. No results for input(s): "LIPASE", "AMYLASE" in the last 168 hours. No results for input(s): "AMMONIA" in the last 168 hours. Coagulation Profile: No results for input(s): "INR", "PROTIME" in the last 168 hours. Cardiac Enzymes: No results for input(s): "CKTOTAL", "CKMB", "CKMBINDEX", "TROPONINI" in the last 168 hours. BNP (last  3 results) No results for input(s): "PROBNP" in the last 8760 hours. HbA1C: No results for input(s): "HGBA1C" in the last 72 hours. CBG: No results for input(s): "GLUCAP" in the last 168 hours. Lipid Profile: No results for input(s): "CHOL", "HDL", "LDLCALC", "TRIG", "CHOLHDL", "LDLDIRECT" in the last 72 hours. Thyroid Function Tests: No results for input(s): "TSH", "T4TOTAL", "FREET4", "T3FREE", "THYROIDAB" in the last 72 hours. Anemia Panel: No results for input(s): "VITAMINB12", "FOLATE", "FERRITIN", "TIBC", "IRON", "RETICCTPCT" in the last 72 hours. Urine analysis: No results found for: "COLORURINE", "APPEARANCEUR", "LABSPEC", "PHURINE", "GLUCOSEU", "HGBUR", "BILIRUBINUR", "KETONESUR", "PROTEINUR", "UROBILINOGEN", "NITRITE", "LEUKOCYTESUR"  Radiological Exams on Admission: DG Chest 2 View  Result Date: 07/21/2021 CLINICAL DATA:  Shortness of breath, COPD exacerbation, hypoxia EXAM: CHEST - 2 VIEW COMPARISON:  02/01/2021 FINDINGS: normal heart size and vascularity. Similar hyperinflation with scattered bilateral pleuroparenchymal scarring most pronounced  in the left upper lobe and the right lower lung. No definite superimposed pneumonia, edema, collapse or consolidation. No large effusion or pneumothorax. Trachea midline. No acute osseous finding. IMPRESSION: Stable chronic severe emphysema pattern. No superimposed acute process by plain radiography. Electronically Signed   By: Judie Petit.  Shick M.D.   On: 07/21/2021 20:42    EKG: Independently reviewed. See above  Assessment/Plan Acute COPD exacerbation with acute hypoxic respiratory failure  -in background of bronchiectasis /chronic hypcapnea -admit to progressive care  - solumedrol iv , bid taper to baseline 10 mg po prednisone daily - cefepime/ azithromycin due to hx of bronchiectasis  -f/u on sputum cultures  -consider further imaging of chest if patient does not improve -cxr : NAD - nebs standing and prn  -resume chronic inhalers as  able -pulmonary toilet  -wean O2 as able   Hx of Tobacco abuse -per patient in remission x 4 months  Hx of Lung adenocarcinoma s/p resection 2019 -in remission    DVT prophylaxis: heparin  Code Status: full Family Communication: none at bedside  Disposition Plan: patient  expected to be admitted greater than 2 midnights  Consults called: n/a Admission status: progressive   Lurline Del MD [facility]   If 7PM-7AM, please contact night-coverage [email or web address] Password [facility]  07/22/2021, 12:04 AM

## 2021-07-22 NOTE — Progress Notes (Signed)
Patient discharged home.  Discharge instructions explained, patient verbalizes understanding 

## 2022-04-06 ENCOUNTER — Encounter (HOSPITAL_COMMUNITY): Payer: Self-pay

## 2022-04-06 ENCOUNTER — Other Ambulatory Visit: Payer: Self-pay

## 2022-04-06 ENCOUNTER — Emergency Department (HOSPITAL_COMMUNITY): Payer: No Typology Code available for payment source

## 2022-04-06 ENCOUNTER — Inpatient Hospital Stay (HOSPITAL_COMMUNITY)
Admission: EM | Admit: 2022-04-06 | Discharge: 2022-04-08 | DRG: 193 | Disposition: A | Discharge status: Home Health | Payer: No Typology Code available for payment source | Attending: Internal Medicine | Admitting: Internal Medicine

## 2022-04-06 DIAGNOSIS — Z85118 Personal history of other malignant neoplasm of bronchus and lung: Secondary | ICD-10-CM

## 2022-04-06 DIAGNOSIS — Z87891 Personal history of nicotine dependence: Secondary | ICD-10-CM | POA: Diagnosis not present

## 2022-04-06 DIAGNOSIS — J189 Pneumonia, unspecified organism: Principal | ICD-10-CM | POA: Diagnosis present

## 2022-04-06 DIAGNOSIS — B348 Other viral infections of unspecified site: Secondary | ICD-10-CM | POA: Diagnosis present

## 2022-04-06 DIAGNOSIS — D649 Anemia, unspecified: Secondary | ICD-10-CM | POA: Diagnosis present

## 2022-04-06 DIAGNOSIS — Z79899 Other long term (current) drug therapy: Secondary | ICD-10-CM

## 2022-04-06 DIAGNOSIS — G629 Polyneuropathy, unspecified: Secondary | ICD-10-CM | POA: Diagnosis present

## 2022-04-06 DIAGNOSIS — J9621 Acute and chronic respiratory failure with hypoxia: Secondary | ICD-10-CM

## 2022-04-06 DIAGNOSIS — E871 Hypo-osmolality and hyponatremia: Secondary | ICD-10-CM | POA: Diagnosis present

## 2022-04-06 DIAGNOSIS — F1410 Cocaine abuse, uncomplicated: Secondary | ICD-10-CM | POA: Diagnosis present

## 2022-04-06 DIAGNOSIS — Z1152 Encounter for screening for COVID-19: Secondary | ICD-10-CM

## 2022-04-06 DIAGNOSIS — Z9981 Dependence on supplemental oxygen: Secondary | ICD-10-CM | POA: Diagnosis not present

## 2022-04-06 DIAGNOSIS — J440 Chronic obstructive pulmonary disease with acute lower respiratory infection: Secondary | ICD-10-CM | POA: Diagnosis present

## 2022-04-06 DIAGNOSIS — E860 Dehydration: Secondary | ICD-10-CM | POA: Diagnosis present

## 2022-04-06 DIAGNOSIS — J441 Chronic obstructive pulmonary disease with (acute) exacerbation: Secondary | ICD-10-CM | POA: Diagnosis present

## 2022-04-06 DIAGNOSIS — E876 Hypokalemia: Secondary | ICD-10-CM | POA: Diagnosis present

## 2022-04-06 LAB — CBC WITH DIFFERENTIAL/PLATELET
Abs Immature Granulocytes: 0.02 10*3/uL (ref 0.00–0.07)
Basophils Absolute: 0 10*3/uL (ref 0.0–0.1)
Basophils Relative: 1 %
Eosinophils Absolute: 0.2 10*3/uL (ref 0.0–0.5)
Eosinophils Relative: 3 %
HCT: 39 % (ref 39.0–52.0)
Hemoglobin: 12.9 g/dL — ABNORMAL LOW (ref 13.0–17.0)
Immature Granulocytes: 0 %
Lymphocytes Relative: 11 %
Lymphs Abs: 0.7 10*3/uL (ref 0.7–4.0)
MCH: 30.8 pg (ref 26.0–34.0)
MCHC: 33.1 g/dL (ref 30.0–36.0)
MCV: 93.1 fL (ref 80.0–100.0)
Monocytes Absolute: 0.7 10*3/uL (ref 0.1–1.0)
Monocytes Relative: 10 %
Neutro Abs: 5 10*3/uL (ref 1.7–7.7)
Neutrophils Relative %: 75 %
Platelets: 383 10*3/uL (ref 150–400)
RBC: 4.19 MIL/uL — ABNORMAL LOW (ref 4.22–5.81)
RDW: 13.2 % (ref 11.5–15.5)
WBC: 6.6 10*3/uL (ref 4.0–10.5)
nRBC: 0 % (ref 0.0–0.2)

## 2022-04-06 LAB — BASIC METABOLIC PANEL
Anion gap: 8 (ref 5–15)
BUN: 12 mg/dL (ref 6–20)
CO2: 27 mmol/L (ref 22–32)
Calcium: 8.6 mg/dL — ABNORMAL LOW (ref 8.9–10.3)
Chloride: 98 mmol/L (ref 98–111)
Creatinine, Ser: 0.72 mg/dL (ref 0.61–1.24)
GFR, Estimated: >60 mL/min (ref >60)
Glucose, Bld: 104 mg/dL — ABNORMAL HIGH (ref 70–99)
Potassium: 3.3 mmol/L — ABNORMAL LOW (ref 3.5–5.1)
Sodium: 133 mmol/L — ABNORMAL LOW (ref 135–145)

## 2022-04-06 LAB — TROPONIN I (HIGH SENSITIVITY)
Troponin I (High Sensitivity): 4 ng/L (ref <18)
Troponin I (High Sensitivity): 4 ng/L (ref <18)

## 2022-04-06 LAB — RAPID URINE DRUG SCREEN, HOSP PERFORMED
Amphetamines: NOT DETECTED
Barbiturates: NOT DETECTED
Benzodiazepines: NOT DETECTED
Cocaine: POSITIVE — AB
Opiates: NOT DETECTED
Tetrahydrocannabinol: NOT DETECTED

## 2022-04-06 LAB — D-DIMER, QUANTITATIVE: D-Dimer, Quant: 0.4 ug/mL-FEU (ref 0.00–0.50)

## 2022-04-06 LAB — LACTIC ACID, PLASMA: Lactic Acid, Venous: 1.6 mmol/L (ref 0.5–1.9)

## 2022-04-06 LAB — RESP PANEL BY RT-PCR (RSV, FLU A&B, COVID)  RVPGX2
Influenza A by PCR: NEGATIVE
Influenza B by PCR: NEGATIVE
Resp Syncytial Virus by PCR: NEGATIVE
SARS Coronavirus 2 by RT PCR: NEGATIVE

## 2022-04-06 MED ORDER — ACETAMINOPHEN 325 MG PO TABS: 650.0000 mg | ORAL_TABLET | Freq: Four times a day (QID) | ORAL | Status: DC | PRN

## 2022-04-06 MED ORDER — IPRATROPIUM BROMIDE 0.02 % IN SOLN: 0.5000 mg | Freq: Four times a day (QID) | RESPIRATORY_TRACT | Status: DC

## 2022-04-06 MED ORDER — GABAPENTIN 300 MG PO CAPS
300.0000 mg | ORAL_CAPSULE | Freq: Every day | ORAL | Status: DC
  Administered 2022-04-06 – 2022-04-07 (×2): 300 mg via ORAL
  Filled 2022-04-06 (×2): qty 1

## 2022-04-06 MED ORDER — IPRATROPIUM BROMIDE 0.02 % IN SOLN
0.5000 mg | Freq: Four times a day (QID) | RESPIRATORY_TRACT | Status: DC
  Administered 2022-04-06 – 2022-04-07 (×3): 0.5 mg via RESPIRATORY_TRACT
  Filled 2022-04-06 (×3): qty 2.5

## 2022-04-06 MED ORDER — FLUTICASONE PROPIONATE 50 MCG/ACT NA SUSP
1.0000 | Freq: Every day | NASAL | Status: DC
  Administered 2022-04-06: 1 via NASAL
  Filled 2022-04-06: qty 16

## 2022-04-06 MED ORDER — METHYLPREDNISOLONE SODIUM SUCC 125 MG IJ SOLR
125.0000 mg | Freq: Once | INTRAMUSCULAR | Status: AC
  Administered 2022-04-06: 125 mg via INTRAVENOUS
  Filled 2022-04-06: qty 2

## 2022-04-06 MED ORDER — IBUPROFEN 800 MG PO TABS
800.0000 mg | ORAL_TABLET | Freq: Two times a day (BID) | ORAL | Status: DC
  Administered 2022-04-06 – 2022-04-08 (×4): 800 mg via ORAL
  Filled 2022-04-06 (×4): qty 1

## 2022-04-06 MED ORDER — LACTATED RINGERS IV BOLUS: 500.0000 mL | Freq: Once | INTRAVENOUS | Status: DC

## 2022-04-06 MED ORDER — BUDESONIDE 0.25 MG/2ML IN SUSP
0.2500 mg | Freq: Two times a day (BID) | RESPIRATORY_TRACT | Status: DC
  Administered 2022-04-06 – 2022-04-07 (×2): 0.25 mg via RESPIRATORY_TRACT
  Filled 2022-04-06 (×2): qty 2

## 2022-04-06 MED ORDER — ACETAMINOPHEN 650 MG RE SUPP: 650.0000 mg | Freq: Four times a day (QID) | RECTAL | Status: DC | PRN

## 2022-04-06 MED ORDER — ARFORMOTEROL TARTRATE 15 MCG/2ML IN NEBU
15.0000 ug | INHALATION_SOLUTION | Freq: Two times a day (BID) | RESPIRATORY_TRACT | Status: DC
  Administered 2022-04-06 – 2022-04-08 (×4): 15 ug via RESPIRATORY_TRACT
  Filled 2022-04-06 (×3): qty 2

## 2022-04-06 MED ORDER — SODIUM CHLORIDE 0.9 % IV SOLN
2.0000 g | Freq: Once | INTRAVENOUS | Status: AC
  Administered 2022-04-06: 2 g via INTRAVENOUS
  Filled 2022-04-06: qty 20

## 2022-04-06 MED ORDER — HYDRALAZINE HCL 20 MG/ML IJ SOLN: 10.0000 mg | Freq: Four times a day (QID) | INTRAMUSCULAR | Status: DC | PRN

## 2022-04-06 MED ORDER — ALBUTEROL SULFATE (2.5 MG/3ML) 0.083% IN NEBU
10.0000 mg/h | INHALATION_SOLUTION | RESPIRATORY_TRACT | Status: DC
  Administered 2022-04-06: 10 mg/h via RESPIRATORY_TRACT
  Filled 2022-04-06: qty 12

## 2022-04-06 MED ORDER — METHYLPREDNISOLONE SODIUM SUCC 125 MG IJ SOLR
60.0000 mg | Freq: Two times a day (BID) | INTRAMUSCULAR | Status: DC
  Administered 2022-04-06 – 2022-04-08 (×4): 60 mg via INTRAVENOUS
  Filled 2022-04-06 (×4): qty 2

## 2022-04-06 MED ORDER — IPRATROPIUM-ALBUTEROL 0.5-2.5 (3) MG/3ML IN SOLN
3.0000 mL | Freq: Once | RESPIRATORY_TRACT | Status: AC
  Administered 2022-04-06: 3 mL via RESPIRATORY_TRACT
  Filled 2022-04-06: qty 3

## 2022-04-06 MED ORDER — LEVALBUTEROL HCL 0.63 MG/3ML IN NEBU
0.6300 mg | INHALATION_SOLUTION | Freq: Four times a day (QID) | RESPIRATORY_TRACT | Status: DC
  Administered 2022-04-06 – 2022-04-07 (×2): 0.63 mg via RESPIRATORY_TRACT
  Filled 2022-04-06 (×2): qty 3

## 2022-04-06 MED ORDER — OXYCODONE HCL 5 MG PO TABS
5.0000 mg | ORAL_TABLET | ORAL | Status: DC | PRN
  Administered 2022-04-06 – 2022-04-07 (×2): 5 mg via ORAL
  Filled 2022-04-06 (×2): qty 1

## 2022-04-06 MED ORDER — GUAIFENESIN ER 600 MG PO TB12
1200.0000 mg | ORAL_TABLET | Freq: Two times a day (BID) | ORAL | Status: DC
  Administered 2022-04-06 – 2022-04-08 (×4): 1200 mg via ORAL
  Filled 2022-04-06 (×4): qty 2

## 2022-04-06 MED ORDER — ALBUTEROL SULFATE (2.5 MG/3ML) 0.083% IN NEBU
2.5000 mg | INHALATION_SOLUTION | Freq: Once | RESPIRATORY_TRACT | Status: AC
  Administered 2022-04-06: 2.5 mg via RESPIRATORY_TRACT
  Filled 2022-04-06: qty 3

## 2022-04-06 MED ORDER — MAGNESIUM SULFATE 2 GM/50ML IV SOLN
2.0000 g | Freq: Once | INTRAVENOUS | Status: AC
  Administered 2022-04-06: 2 g via INTRAVENOUS
  Filled 2022-04-06: qty 50

## 2022-04-06 MED ORDER — IPRATROPIUM-ALBUTEROL 0.5-2.5 (3) MG/3ML IN SOLN
3.0000 mL | Freq: Once | RESPIRATORY_TRACT | Status: AC
Start: 2022-04-06 — End: 2022-04-06
  Administered 2022-04-06: 3 mL via RESPIRATORY_TRACT
  Filled 2022-04-06: qty 3

## 2022-04-06 MED ORDER — LACTATED RINGERS IV BOLUS
1000.0000 mL | Freq: Once | INTRAVENOUS | Status: AC
  Administered 2022-04-06: 1000 mL via INTRAVENOUS

## 2022-04-06 MED ORDER — ONDANSETRON HCL 4 MG/2ML IJ SOLN: 4.0000 mg | Freq: Four times a day (QID) | INTRAMUSCULAR | Status: DC | PRN

## 2022-04-06 MED ORDER — ENOXAPARIN SODIUM 40 MG/0.4ML IJ SOSY
40.0000 mg | PREFILLED_SYRINGE | INTRAMUSCULAR | Status: DC
  Administered 2022-04-06 – 2022-04-07 (×2): 40 mg via SUBCUTANEOUS
  Filled 2022-04-06 (×2): qty 0.4

## 2022-04-06 MED ORDER — LEVALBUTEROL HCL 0.63 MG/3ML IN NEBU: 0.6300 mg | INHALATION_SOLUTION | Freq: Four times a day (QID) | RESPIRATORY_TRACT | Status: DC

## 2022-04-06 MED ORDER — SODIUM CHLORIDE 0.9 % IV SOLN
100.0000 mg | Freq: Two times a day (BID) | INTRAVENOUS | Status: DC
  Administered 2022-04-06: 100 mg via INTRAVENOUS
  Filled 2022-04-06 (×2): qty 100

## 2022-04-06 MED ORDER — ONDANSETRON HCL 4 MG PO TABS: 4.0000 mg | ORAL_TABLET | Freq: Four times a day (QID) | ORAL | Status: DC | PRN

## 2022-04-06 MED ORDER — SODIUM CHLORIDE 0.9 % IV SOLN: INTRAVENOUS | Status: AC

## 2022-04-06 MED ORDER — MORPHINE SULFATE (PF) 2 MG/ML IV SOLN: 2.0000 mg | INTRAVENOUS | Status: DC | PRN

## 2022-04-06 NOTE — ED Provider Notes (Signed)
[facility] Provider Note   CSN: [id number] Arrival date & time: 04/06/22  1244     History  Chief Complaint  Patient presents with   Shortness of Breath    Jesse Powell is a 57 y.o. male.   Shortness of Breath    Patient with medical history of COPD, chronic respiratory failure on 2 L supplemental at home presents to the emergency department due to shortness of breath and cough.  Patient has been having increasing shortness of breath over the last week with a productive cough with increased mucus production.  States she has been on azithromycin, he has been on a lower steroid taper but only taking 5 mg at home.  Denies any fevers at home but he is having chest pain which is worse when he is not breathing and also present at rest.  Denies any lower extremity swelling, no history of CHF.    Home Medications Prior to Admission medications   Medication Sig Start Date End Date Taking? Authorizing Provider  albuterol (PROVENTIL HFA;VENTOLIN HFA) 108 (90 BASE) MCG/ACT inhaler Inhale 2 puffs into the lungs every 2 (two) hours as needed for wheezing or shortness of breath (cough). 02/07/14   Street, [place], PA-C  albuterol (PROVENTIL) (2.5 MG/3ML) 0.083% nebulizer solution Take 2.5 mg by nebulization every 6 (six) hours as needed for wheezing or shortness of breath. 03/05/21   [provider]  azithromycin (ZITHROMAX) 250 MG tablet Take 250 mg by mouth every Monday, Wednesday, and Friday. 10/06/19   [provider]  cyclobenzaprine (FLEXERIL) 10 MG tablet Take 1 tablet by mouth at bedtime as needed for muscle spasms. 08/08/20   [provider]  fluticasone (FLONASE) 50 MCG/ACT nasal spray Place 1 spray into both nostrils daily. 02/02/21 02/02/22  Debbe Odea, MD  gabapentin (NEURONTIN) 300 MG capsule Take 300 mg by mouth at bedtime.    [provider]  ibuprofen (ADVIL,MOTRIN) 800 MG tablet Take 800 mg by  mouth 2 (two) times daily.    [provider]  mometasone [facility]) 220 MCG/INH inhaler Inhale 2 puffs into the lungs at bedtime. 09/06/19   [provider]  predniSONE (DELTASONE) 10 MG tablet Please take prednisone daily with breakfast  Take '40mg'$  on 6/12, then '30mg'$  on 6/13, '20mg'$  on 6/14, '10mg'$  on 6/15, stay on '10mg'$  daily with breakfast , further dose adjustment per your [facility] pulmonologist  Your appointment with [facility] pulmonologist is on 6/15 07/22/21   Florencia Reasons, MD  Tiotropium Bromide-Olodaterol 2.5-2.5 MCG/ACT AERS Inhale 2 puffs into the lungs in the morning. 07/22/21   Florencia Reasons, MD      Allergies    Patient has no known allergies.    Review of Systems   Review of Systems  Respiratory:  Positive for shortness of breath.     Physical Exam Updated Vital Signs BP (!) 136/98   Pulse (!) 111   Temp (!) 97.5 F (36.4 C) (Oral)   Resp (!) 27   Ht '5\' 9"'$  (1.753 m)   Wt 65 kg   SpO2 99%   BMI 21.16 kg/m  Physical Exam Vitals and nursing note reviewed. Exam conducted with a chaperone present.  Constitutional:      Appearance: Normal appearance.  HENT:     Head: Normocephalic and atraumatic.  Eyes:     General: No scleral icterus.       Right eye: No discharge.        Left eye: No  discharge.     Extraocular Movements: Extraocular movements intact.     Pupils: Pupils are equal, round, and reactive to light.  Cardiovascular:     Rate and Rhythm: Normal rate and regular rhythm.     Pulses: Normal pulses.     Heart sounds: Normal heart sounds. No murmur heard.    No friction rub. No gallop.  Pulmonary:     Effort: Pulmonary effort is normal. Tachypnea present. No respiratory distress.     Breath sounds: Wheezing and rhonchi present.     Comments: Tripoding, unable to speak in complete sentences  Abdominal:     General: Abdomen is flat. Bowel sounds are normal. There is no distension.     Palpations: Abdomen is soft.     Tenderness: There is no abdominal tenderness.   Skin:    General: Skin is warm and dry.     Coloration: Skin is not jaundiced.  Neurological:     Mental Status: He is alert. Mental status is at baseline.     Coordination: Coordination normal.     ED Results / Procedures / Treatments   Labs (all labs ordered are listed, but only abnormal results are displayed) Labs Reviewed  BASIC METABOLIC PANEL - Abnormal; Notable for the following components:      Result Value   Sodium 133 (*)    Potassium 3.3 (*)    Glucose, Bld 104 (*)    Calcium 8.6 (*)    All other components within normal limits  CBC WITH DIFFERENTIAL/PLATELET - Abnormal; Notable for the following components:   RBC 4.19 (*)    Hemoglobin 12.9 (*)    All other components within normal limits  RESP PANEL BY RT-PCR (RSV, FLU A&B, COVID)  RVPGX2  CULTURE, BLOOD (ROUTINE X 2)  CULTURE, BLOOD (ROUTINE X 2)  LACTIC ACID, PLASMA  LACTIC ACID, PLASMA  RAPID URINE DRUG SCREEN, HOSP PERFORMED  TROPONIN I (HIGH SENSITIVITY)  TROPONIN I (HIGH SENSITIVITY)    EKG None  Radiology DG Chest Port 1 View  Result Date: 04/06/2022 CLINICAL DATA:  Shortness of breath.  COPD. EXAM: PORTABLE CHEST 1 VIEW COMPARISON:  Chest radiographs 07/21/2021 and 02/01/2021; CT chest 07/23/2018 FINDINGS: Cardiac silhouette and mediastinal contours are within normal limits. There is again a chronic posterior right lower lobe linear band of scarring. Compared to the most recent 07/21/2021 radiographs, there is increased opacification overlying the medial right lower lung scarring, which may represent aspiration or pneumonia. There are lucencies consistent with paraseptal emphysematous changes and bullae predominantly within the superomedial right lung in the right-greater-than-left inferior lungs. No pleural effusion or pneumothorax.  No acute skeletal abnormality. IMPRESSION: 1. Compared to the most recent 07/21/2021 radiographs, there is increased opacification overlying the medial right lower lung  scarring, which may represent aspiration or pneumonia. 2. Chronic posterior right lower lobe linear band of scarring. 3. Chronic paraseptal emphysematous changes and bullae predominantly within the superomedial right lung. Electronically Signed   By: Yvonne Kendall M.D.   On: 04/06/2022 14:22    Procedures Procedures    Medications Ordered in ED Medications  albuterol (PROVENTIL) (2.5 MG/3ML) 0.083% nebulizer solution (10 mg/hr Nebulization New Bag/Given 04/06/22 1427)  lactated ringers bolus 1,000 mL (has no administration in time range)  budesonide (PULMICORT) nebulizer solution 0.25 mg (has no administration in time range)  arformoterol (BROVANA) nebulizer solution 15 mcg (has no administration in time range)  guaiFENesin (MUCINEX) 12 hr tablet 1,200 mg (has no administration in time range)  levalbuterol (XOPENEX) nebulizer solution 0.63 mg (has no administration in time range)  ipratropium (ATROVENT) nebulizer solution 0.5 mg (has no administration in time range)  albuterol (PROVENTIL) (2.5 MG/3ML) 0.083% nebulizer solution 2.5 mg (2.5 mg Nebulization Given 04/06/22 1325)  ipratropium-albuterol (DUONEB) 0.5-2.5 (3) MG/3ML nebulizer solution 3 mL (3 mLs Nebulization Given 04/06/22 1325)  methylPREDNISolone sodium succinate (SOLU-MEDROL) 125 mg/2 mL injection 125 mg (125 mg Intravenous Given 04/06/22 1322)  magnesium sulfate IVPB 2 g 50 mL (0 g Intravenous Stopped 04/06/22 1431)  ipratropium-albuterol (DUONEB) 0.5-2.5 (3) MG/3ML nebulizer solution 3 mL (3 mLs Nebulization Given 04/06/22 1359)  cefTRIAXone (ROCEPHIN) 2 g in sodium chloride 0.9 % 100 mL IVPB (2 g Intravenous New Bag/Given 04/06/22 1550)    ED Course/ Medical Decision Making/ A&P Clinical Course as of 04/06/22 1637  Sat Apr 06, 2022  1441 I have reevaluated the patient multiple times. Despite three breathing treatments, solumedrol and mag he is still wheezing. Slighly less tachypneic and no longer tripoding. Discussed workup and  possible admission with patient. States he has no one to watch his dog and would like to avoid a hospitalization. Will trial one hour continuous neb and reevaluated.  [HS]    Clinical Course User Index [HS] Sherrill Raring, PA-C                             Medical Decision Making Amount and/or Complexity of Data Reviewed Labs: ordered. Radiology: ordered.  Risk Prescription drug management. Decision regarding hospitalization.   DDX includes COPD exacerbation, pneumonia, new onset CHF, atypical ACS, arrhythmia.  On exam patient has diffuse and story next with Tory wheezing, tachypnea concerning for COPD exacerbation.  He is not above his basic supplemental oxygen requirement, he is mildly tachycardic but with regular rhythm.    I ordered 3 DuoNeb's, a continuous hour-long nebulizer treatment, Solu-Medrol 125, 4 mg of magnesium.  Laboratory workup negative for COVID and flu, chest x-ray with possible pneumonia.  No leukocytosis, stable anemia.  Troponin is low, do not think this is consistent with ACS.  I considered sending the patient home but given he has been failing outpatient treatment of antibiotics, has diffuse wheezing I do think he needs admission for COPD exacerbation secondary to underlying pneumonia.  I consulted with the hospitalist who agrees with admission requesting lactic acid, blood culture and fluid which I have ordered.        Final Clinical Impression(s) / ED Diagnoses Final diagnoses:  COPD exacerbation ([place])  Community acquired pneumonia, unspecified laterality    Rx / DC Orders ED Discharge Orders     None         Sherrill Raring, [place] 04/06/22 1637    Ezequiel Essex, MD 04/06/22 1650

## 2022-04-06 NOTE — ED Triage Notes (Signed)
Patient has COPD and said he has been short of breath for 2 weeks. Wears 2L nasal cannula at baseline. Tripod position in triage. Short of breath at rest.

## 2022-04-06 NOTE — H&P (Signed)
History and Physical    Patient: Jesse Powell [id number] DOB: 01/11/1966 DOA: 04/06/2022 DOS: the patient was seen and examined on 04/06/2022 PCP: [facility]   Patient coming from: Home  Chief Complaint:  Chief Complaint  Patient presents with   Shortness of Breath   HPI: Jesse Powell is a 57 y.o. male with medical history significant of COPD with chronic respiratory failure with hypoxia on 2 L supplemental oxygen via nasal cannula, neuropathy, tobacco abuse, cocaine abuse as well as other comorbidities who presents with worsening dyspnea for last 2 weeks with shortness of breath and a productive way and increased mucus production and cough.  He states that he had seen his primary care provider who had placed him on azithromycin and started him on a steroid taper however patient states that he has not taken steroid taper properly and started having worsening of his symptoms.  He states to the point where he could not even lay flat or sleep.  Starts having these coughing spells but denies any increased leg swelling or having any cysts history of CHF.  Patient states that he used his albuterol inhaler and had 60 puffs a few days ago and is now down to 7 given his continued use of the albuterol [place].  States that he has had lung surgery in the past and has had neuropathy and states that his neuropathy is causing his issues.  Patient continued to worsen and states that as long doctors at [facility] but he states that because he started worsening he presented to the ED for further evaluation.  Patient states that his mucus is greenish-yellowish and has worsened and was concerned that he had a pneumonia.  [facility] was asked admit this patient for acute respiratory failure with hypoxia in the setting of acute exacerbation of COPD with concomitant pneumonia.   Review of Systems: As mentioned in the history of present illness. All other systems reviewed and are negative. Past Medical History:   Diagnosis Date   COPD (chronic obstructive pulmonary disease) ([place])    Past Surgical History:  Procedure Laterality Date   LUNG SURGERY  2019   Social History:  reports that he has quit smoking. His smoking use included cigarettes. He smoked an average of .5 packs per day. He has never used smokeless tobacco. He reports current alcohol use of about 3.0 standard drinks of alcohol per week. He reports that he does not use drugs.  No Known Allergies  FAMILY HISTORY History reviewed. No pertinent family history related to the patient's presenting complaint.  Prior to Admission medications   Medication Sig Start Date End Date Taking? Authorizing Provider  albuterol (PROVENTIL HFA;VENTOLIN HFA) 108 (90 BASE) MCG/ACT inhaler Inhale 2 puffs into the lungs every 2 (two) hours as needed for wheezing or shortness of breath (cough). 02/07/14   Street, [place], PA-C  albuterol (PROVENTIL) (2.5 MG/3ML) 0.083% nebulizer solution Take 2.5 mg by nebulization every 6 (six) hours as needed for wheezing or shortness of breath. 03/05/21   [provider]  azithromycin (ZITHROMAX) 250 MG tablet Take 250 mg by mouth every Monday, Wednesday, and Friday. 10/06/19   [provider]  cyclobenzaprine (FLEXERIL) 10 MG tablet Take 1 tablet by mouth at bedtime as needed for muscle spasms. 08/08/20   [provider]  fluticasone (FLONASE) 50 MCG/ACT nasal spray Place 1 spray into both nostrils daily. 02/02/21 02/02/22  Debbe Odea, MD  gabapentin (NEURONTIN) 300 MG capsule Take 300 mg by mouth at bedtime.  [provider]  ibuprofen (ADVIL,MOTRIN) 800 MG tablet Take 800 mg by mouth 2 (two) times daily.    [provider]  mometasone [facility]) 220 MCG/INH inhaler Inhale 2 puffs into the lungs at bedtime. 09/06/19   [provider]  predniSONE (DELTASONE) 10 MG tablet Please take prednisone daily with breakfast  Take '40mg'$  on 6/12, then '30mg'$  on 6/13, '20mg'$  on 6/14, '10mg'$   on 6/15, stay on '10mg'$  daily with breakfast , further dose adjustment per your [facility]  Your appointment with [facility] pulmonologist is on 6/15 07/22/21   Florencia Reasons, MD  Tiotropium Bromide-Olodaterol 2.5-2.5 MCG/ACT AERS Inhale 2 puffs into the lungs in the morning. 07/22/21   Florencia Reasons, MD    Physical Exam: Vitals:   04/06/22 1300 04/06/22 1400 04/06/22 1427 04/06/22 1600  BP: (!) 152/97 (!) 136/98    Pulse: (!) 103 90  (!) 111  Resp: (!) 21 (!) 23  (!) 27  Temp:      TempSrc:      SpO2: 94% 99% 94% 99%  Weight:      Height:       Examination: Physical Exam:  Constitutional: Thin chronically ill-appearing African-American male currently in moderate respiratory distress Respiratory: Diminished to auscultation bilaterally with coarse breath sounds and expiratory wheezing as well as some rhonchi.  Has increased respiratory effort slightly and is wearing supplemental oxygen via nasal cannula but not using accessory muscles to breathe Cardiovascular: Tachycardic rate but regular rhythm, no murmurs / rubs / gallops. S1 and S2 auscultated. mild extremity edema Abdomen: Soft, non-tender, non-distended. Bowel sounds positive.  GU: Deferred. Musculoskeletal: No clubbing / cyanosis of digits/nails. No joint deformity upper and lower extremities.  Skin: No rashes, lesions, ulcers limited skin evaluation. No induration; Warm and dry.  Neurologic: CN 2-12 grossly intact with no focal deficits. Romberg sign and cerebellar reflexes not assessed.  Psychiatric: Normal judgment and insight. Alert and oriented x 3. Anxious mood and appropriate affect.   Data Reviewed: Recent Results (from the past 2160 hour(s))  Resp panel by RT-PCR (RSV, Flu A&B, Covid) Anterior Nasal Swab     Status: None   Collection Time: 04/06/22 12:56 PM   Specimen: Anterior Nasal Swab  Result Value Ref Range   SARS Coronavirus 2 by RT PCR NEGATIVE NEGATIVE    Comment: (NOTE) SARS-CoV-2 target nucleic acids are NOT  DETECTED.  The SARS-CoV-2 RNA is generally detectable in upper respiratory specimens during the acute phase of infection. The lowest concentration of SARS-CoV-2 viral copies this assay can detect is 138 copies/mL. A negative result does not preclude SARS-Cov-2 infection and should not be used as the sole basis for treatment or other patient management decisions. A negative result may occur with  improper specimen collection/handling, submission of specimen other than nasopharyngeal swab, presence of viral mutation(s) within the areas targeted by this assay, and inadequate number of viral copies(<138 copies/mL). A negative result must be combined with clinical observations, patient history, and epidemiological information. The expected result is Negative.  Fact Sheet for Patients:  EntrepreneurPulse.com.au  Fact Sheet for Healthcare Providers:  IncredibleEmployment.be  This test is no t yet approved or cleared by the [place] FDA and  has been authorized for detection and/or diagnosis of SARS-CoV-2 by FDA under an Emergency Use Authorization (EUA). This EUA will remain  in effect (meaning this test can be used) for the duration of the COVID-19 declaration under Section 564(b)(1) of the Act, 21 U.S.C.section 360bbb-3(b)(1), unless the authorization is terminated  or revoked sooner.       Influenza A by PCR NEGATIVE NEGATIVE   Influenza B by PCR NEGATIVE NEGATIVE    Comment: (NOTE) The Xpert Xpress SARS-CoV-2/FLU/RSV plus assay is intended as an aid in the diagnosis of influenza from Nasopharyngeal swab specimens and should not be used as a sole basis for treatment. Nasal washings and aspirates are unacceptable for Xpert Xpress SARS-CoV-2/FLU/RSV testing.  Fact Sheet for Patients: EntrepreneurPulse.com.au  Fact Sheet for Healthcare Providers: IncredibleEmployment.be  This test is not yet approved or  cleared by the [place] FDA and has been authorized for detection and/or diagnosis of SARS-CoV-2 by FDA under an Emergency Use Authorization (EUA). This EUA will remain in effect (meaning this test can be used) for the duration of the COVID-19 declaration under Section 564(b)(1) of the Act, 21 U.S.C. section 360bbb-3(b)(1), unless the authorization is terminated or revoked.     Resp Syncytial Virus by PCR NEGATIVE NEGATIVE    Comment: (NOTE) Fact Sheet for Patients: EntrepreneurPulse.com.au  Fact Sheet for Healthcare Providers: IncredibleEmployment.be  This test is not yet approved or cleared by the [place] FDA and has been authorized for detection and/or diagnosis of SARS-CoV-2 by FDA under an Emergency Use Authorization (EUA). This EUA will remain in effect (meaning this test can be used) for the duration of the COVID-19 declaration under Section 564(b)(1) of the Act, 21 U.S.C. section 360bbb-3(b)(1), unless the authorization is terminated or revoked.  Performed at [facility], [place] [address], [place], [id number]   Basic metabolic panel     Status: Abnormal   Collection Time: 04/06/22  1:22 PM  Result Value Ref Range   Sodium 133 (L) 135 - 145 mmol/L   Potassium 3.3 (L) 3.5 - 5.1 mmol/L   Chloride 98 98 - 111 mmol/L   CO2 27 22 - 32 mmol/L   Glucose, Bld 104 (H) 70 - 99 mg/dL    Comment: Glucose reference range applies only to samples taken after fasting for at least 8 hours.   BUN 12 6 - 20 mg/dL   Creatinine, Ser 0.72 0.61 - 1.24 mg/dL   Calcium 8.6 (L) 8.9 - 10.3 mg/dL   GFR, Estimated >60 >60 mL/min    Comment: (NOTE) Calculated using the CKD-EPI Creatinine Equation (2021)    Anion gap 8 5 - 15    Comment: Performed at [facility], [place] [address] [place], [place] [address]  CBC with Differential/Platelet     Status: Abnormal   Collection Time: 04/06/22  1:22 PM  Result  Value Ref Range   WBC 6.6 4.0 - 10.5 K/uL   RBC 4.19 (L) 4.22 - 5.81 MIL/uL   Hemoglobin 12.9 (L) 13.0 - 17.0 g/dL   HCT 39.0 39.0 - 52.0 %   MCV 93.1 80.0 - 100.0 fL   MCH 30.8 26.0 - 34.0 pg   MCHC 33.1 30.0 - 36.0 g/dL   RDW 13.2 11.5 - 15.5 %   Platelets 383 150 - 400 K/uL   nRBC 0.0 0.0 - 0.2 %   Neutrophils Relative % 75 %   Neutro Abs 5.0 1.7 - 7.7 K/uL   Lymphocytes Relative 11 %   Lymphs Abs 0.7 0.7 - 4.0 K/uL   Monocytes Relative 10 %   Monocytes Absolute 0.7 0.1 - 1.0 K/uL   Eosinophils Relative 3 %   Eosinophils Absolute 0.2 0.0 - 0.5 K/uL   Basophils Relative 1 %   Basophils Absolute 0.0 0.0 -  0.1 K/uL   Immature Granulocytes 0 %   Abs Immature Granulocytes 0.02 0.00 - 0.07 K/uL    Comment: Performed at [facility], [place] [address], [place], [place] [address]  Troponin I (High Sensitivity)     Status: None   Collection Time: 04/06/22  1:22 PM  Result Value Ref Range   Troponin I (High Sensitivity) 4 <18 ng/L    Comment: (NOTE) Elevated high sensitivity troponin I (hsTnI) values and significant  changes across serial measurements may suggest ACS but many other  chronic and acute conditions are known to elevate hsTnI results.  Refer to the "Links" section for chest pain algorithms and additional  guidance. Performed at [facility], [place] [address], [place], [place] [address]   Troponin I (High Sensitivity)     Status: None   Collection Time: 04/06/22  3:35 PM  Result Value Ref Range   Troponin I (High Sensitivity) 4 <18 ng/L    Comment: (NOTE) Elevated high sensitivity troponin I (hsTnI) values and significant  changes across serial measurements may suggest ACS but many other  chronic and acute conditions are known to elevate hsTnI results.  Refer to the "Links" section for chest pain algorithms and additional  guidance. Performed at [facility], [place] [address] [place], [place] [address]   Lactic  acid, plasma     Status: None   Collection Time: 04/06/22  5:09 PM  Result Value Ref Range   Lactic Acid, Venous 1.6 0.5 - 1.9 mmol/L    Comment: Performed at [facility], [place] [address] [place], [place] [address]  Rapid urine drug screen (hospital performed)     Status: Abnormal   Collection Time: 04/06/22  5:09 PM  Result Value Ref Range   Opiates NONE DETECTED NONE DETECTED   Cocaine POSITIVE (A) NONE DETECTED   Benzodiazepines NONE DETECTED NONE DETECTED   Amphetamines NONE DETECTED NONE DETECTED   Tetrahydrocannabinol NONE DETECTED NONE DETECTED   Barbiturates NONE DETECTED NONE DETECTED    Comment: (NOTE) DRUG SCREEN FOR MEDICAL PURPOSES ONLY.  IF CONFIRMATION IS NEEDED FOR ANY PURPOSE, NOTIFY LAB WITHIN 5 DAYS.  LOWEST DETECTABLE LIMITS FOR URINE DRUG SCREEN Drug Class                     Cutoff (ng/mL) Amphetamine and metabolites    1000 Barbiturate and metabolites    200 Benzodiazepine                 200 Opiates and metabolites        300 Cocaine and metabolites        300 THC                            50 Performed at [facility], [place] [address], [place], [place] [address]    Assessment and Plan:  Dyspnea in the setting of acute exacerbation of COPD with acute on chronic chronic hypoxic respiratory failure (2 Liters baseline) along with ? concomitant pneumonia -Presents with worsening shortness of breath and tachypnea as well as increased sputum production -Influenza A/B as well as RSV and SARS-CoV-2 negative via PCR I will check a respiratory virus panel and place him on droplet precautions -CXR done and showed "Compared to the most recent 07/21/2021 radiographs, there is increased opacification overlying the medial right lower lung scarring, which may represent aspiration or pneumonia. Chronic posterior  right lower lobe linear band of scarring. Chronic paraseptal emphysematous changes and bullae predominantly within the  superomedial right lung." -Was given 125 mg of Solu-Medrol and has not now been changed to IV 60 twice daily X-continue and added Xopenex and Atrovent as well as Brovana and budesonide scheduled -Continue with as needed nebs -Add guaifenesin 1200 g p.o. twice daily, flutter valve and incentive spirometry -Repeat chest x-ray in the a.m. -LA was 1.6 -SpO2: 96 % O2 Flow Rate (L/min): 8 L/min -Added doxycycline given that he had been taking azithromycin in outpatient setting -May consider adding ceftriaxone but not really convinced that he has a pneumonia given no white count but he does have some greenish sputum but will discontinue doxycycline for now -Check urine Legionella and strep urine antigens -Continuous pulse oximetry maintain O2 saturation greater 90% -Continue supplemental oxygen via nasal cannula and wean to home regimen -Will need an ambulatory home O2 screen prior to discharge and repeat chest x-ray -If he does not improve in the a.m. will obtain a CTA of the chest to evaluate for PE but in the interim we will obtain a D-dimer check a BNP  Normocytic Anemia -Hgb/Hct Trend: Recent Labs  Lab 04/06/22 1322  HGB 12.9*  HCT 39.0  MCV 93.1  -Check anemia panel in a.m. -Continue to monitor for signs and symptoms of bleeding; no overt bleeding noted -Repeat CBC in a.m.  Hypokalemia -Patient's K+ Level Trend: Recent Labs  Lab 04/06/22 1322  K 3.3*  -Replete with po KCL 40 mEQ BID x2 -Check Mag level in the AM -Continue to Monitor and Replete as Necessary -Repeat CMP in the AM   Neuropathy -Continue his gabapentin and home ibuprofen  Hyponatremia -Na+ Trend: Recent Labs  Lab 04/06/22 1322  NA 133*  -Given IV fluid hydration with normal saline at 75 MLS per hour for 12 hours -Continue monitor and trend and repeat CMP in a.m.  Chest discomfort in the setting of above -Troponins were flat at 4 -Patient states that it is worse with cough -Patient did do cocaine  recently so we will obtain an EKG which showed sinus tachycardia -Currently not complaining of any chest pain but complains of more dyspnea  Cocaine abuse -UDS positive for cocaine and he states that he did 3 days ago -Counseling given   Advance Care Planning:   Code Status: Prior FULL CODE  Consults: None  Family Communication: No family at bedside   Severity of Illness: The appropriate patient status for this patient is INPATIENT. Inpatient status is judged to be reasonable and necessary in order to provide the required intensity of service to ensure the patient's safety. The patient's presenting symptoms, physical exam findings, and initial radiographic and laboratory data in the context of their chronic comorbidities is felt to place them at high risk for further clinical deterioration. Furthermore, it is not anticipated that the patient will be medically stable for discharge from the hospital within 2 midnights of admission.   * I certify that at the point of admission it is my clinical judgment that the patient will require inpatient hospital care spanning beyond 2 midnights from the point of admission due to high intensity of service, high risk for further deterioration and high frequency of surveillance required.*  Author: Raiford Noble, DO 04/06/2022 4:36 PM For on call review [email or web address].

## 2022-04-07 DIAGNOSIS — J441 Chronic obstructive pulmonary disease with (acute) exacerbation: Secondary | ICD-10-CM | POA: Diagnosis not present

## 2022-04-07 LAB — RESPIRATORY PANEL BY PCR

## 2022-04-07 LAB — CBC WITH DIFFERENTIAL/PLATELET
Abs Immature Granulocytes: 0.01 10*3/uL (ref 0.00–0.07)
Basophils Absolute: 0 10*3/uL (ref 0.0–0.1)
Basophils Relative: 0 %
Eosinophils Absolute: 0 10*3/uL (ref 0.0–0.5)
Eosinophils Relative: 0 %
HCT: 36.1 % — ABNORMAL LOW (ref 39.0–52.0)
Hemoglobin: 12.1 g/dL — ABNORMAL LOW (ref 13.0–17.0)
Immature Granulocytes: 0 %
Lymphocytes Relative: 8 %
Lymphs Abs: 0.4 10*3/uL — ABNORMAL LOW (ref 0.7–4.0)
MCH: 31.1 pg (ref 26.0–34.0)
MCHC: 33.5 g/dL (ref 30.0–36.0)
MCV: 92.8 fL (ref 80.0–100.0)
Monocytes Absolute: 0.1 10*3/uL (ref 0.1–1.0)
Monocytes Relative: 2 %
Neutro Abs: 4.5 10*3/uL (ref 1.7–7.7)
Neutrophils Relative %: 90 %
Platelets: 370 10*3/uL (ref 150–400)
RBC: 3.89 MIL/uL — ABNORMAL LOW (ref 4.22–5.81)
RDW: 13.1 % (ref 11.5–15.5)
WBC: 5 10*3/uL (ref 4.0–10.5)
nRBC: 0 % (ref 0.0–0.2)

## 2022-04-07 LAB — COMPREHENSIVE METABOLIC PANEL
ALT: 12 U/L (ref 0–44)
AST: 19 U/L (ref 15–41)
Albumin: 3.2 g/dL — ABNORMAL LOW (ref 3.5–5.0)
Alkaline Phosphatase: 49 U/L (ref 38–126)
Anion gap: 7 (ref 5–15)
BUN: 13 mg/dL (ref 6–20)
CO2: 27 mmol/L (ref 22–32)
Calcium: 8.9 mg/dL (ref 8.9–10.3)
Chloride: 100 mmol/L (ref 98–111)
Creatinine, Ser: 0.71 mg/dL (ref 0.61–1.24)
GFR, Estimated: >60 mL/min (ref >60)
Glucose, Bld: 150 mg/dL — ABNORMAL HIGH (ref 70–99)
Potassium: 4.2 mmol/L (ref 3.5–5.1)
Sodium: 134 mmol/L — ABNORMAL LOW (ref 135–145)
Total Bilirubin: 0.5 mg/dL (ref 0.3–1.2)
Total Protein: 6.9 g/dL (ref 6.5–8.1)

## 2022-04-07 LAB — BLOOD GAS, ARTERIAL
Acid-Base Excess: 4.3 mmol/L — ABNORMAL HIGH (ref 0.0–2.0)
Bicarbonate: 30.3 mmol/L — ABNORMAL HIGH (ref 20.0–28.0)
Drawn by: 270211
O2 Saturation: 96.7 %
Patient temperature: 37
pCO2 arterial: 50 mmHg — ABNORMAL HIGH (ref 32–48)
pH, Arterial: 7.39 (ref 7.35–7.45)
pO2, Arterial: 77 mmHg — ABNORMAL LOW (ref 83–108)

## 2022-04-07 LAB — GLUCOSE, CAPILLARY: Glucose-Capillary: 147 mg/dL — ABNORMAL HIGH (ref 70–99)

## 2022-04-07 LAB — MAGNESIUM: Magnesium: 2.6 mg/dL — ABNORMAL HIGH (ref 1.7–2.4)

## 2022-04-07 LAB — STREP PNEUMONIAE URINARY ANTIGEN: Strep Pneumo Urinary Antigen: NEGATIVE

## 2022-04-07 LAB — PHOSPHORUS: Phosphorus: 2.9 mg/dL (ref 2.5–4.6)

## 2022-04-07 LAB — TSH: TSH: 0.184 u[IU]/mL — ABNORMAL LOW (ref 0.350–4.500)

## 2022-04-07 LAB — BRAIN NATRIURETIC PEPTIDE: B Natriuretic Peptide: 48.3 pg/mL (ref 0.0–100.0)

## 2022-04-07 MED ORDER — REVEFENACIN 175 MCG/3ML IN SOLN
175.0000 ug | Freq: Every day | RESPIRATORY_TRACT | Status: DC
  Administered 2022-04-07 – 2022-04-08 (×2): 175 ug via RESPIRATORY_TRACT
  Filled 2022-04-07 (×2): qty 3

## 2022-04-07 MED ORDER — ALBUTEROL SULFATE HFA 108 (90 BASE) MCG/ACT IN AERS
2.0000 | INHALATION_SPRAY | RESPIRATORY_TRACT | Status: DC | PRN
  Filled 2022-04-07: qty 6.7

## 2022-04-07 MED ORDER — ALBUTEROL SULFATE (2.5 MG/3ML) 0.083% IN NEBU: 3.0000 mL | INHALATION_SOLUTION | RESPIRATORY_TRACT | Status: DC | PRN

## 2022-04-07 MED ORDER — LEVALBUTEROL HCL 0.63 MG/3ML IN NEBU
0.6300 mg | INHALATION_SOLUTION | Freq: Four times a day (QID) | RESPIRATORY_TRACT | Status: DC
  Administered 2022-04-07: 0.63 mg via RESPIRATORY_TRACT
  Filled 2022-04-07: qty 3

## 2022-04-07 MED ORDER — BUDESONIDE 0.5 MG/2ML IN SUSP
0.5000 mg | Freq: Two times a day (BID) | RESPIRATORY_TRACT | Status: DC
  Administered 2022-04-07 – 2022-04-08 (×2): 0.5 mg via RESPIRATORY_TRACT
  Filled 2022-04-07 (×2): qty 2

## 2022-04-07 MED ORDER — DOXYCYCLINE HYCLATE 100 MG PO TABS
100.0000 mg | ORAL_TABLET | Freq: Two times a day (BID) | ORAL | Status: DC
  Administered 2022-04-07 – 2022-04-08 (×3): 100 mg via ORAL
  Filled 2022-04-07 (×3): qty 1

## 2022-04-07 MED ORDER — LEVALBUTEROL HCL 0.63 MG/3ML IN NEBU
0.6300 mg | INHALATION_SOLUTION | Freq: Four times a day (QID) | RESPIRATORY_TRACT | Status: DC | PRN
  Administered 2022-04-07: 0.63 mg via RESPIRATORY_TRACT
  Filled 2022-04-07: qty 3

## 2022-04-07 MED ORDER — SODIUM CHLORIDE 0.9 % IV SOLN
2.0000 g | INTRAVENOUS | Status: DC
  Administered 2022-04-07: 2 g via INTRAVENOUS
  Filled 2022-04-07: qty 20

## 2022-04-07 NOTE — Progress Notes (Signed)
PROGRESS NOTE  Jesse Powell  [id number] DOB: [birth year] DOA: 04/06/2022 PCP: [facility]   Brief Narrative: Patient is a 57 year old male with history of COPD, chronic hypoxic respiratory failure on 2L  oxygen at home, neuropathy, tobacco abuse, cocaine abuse who presents with worsening dyspnea for 2 weeks, productive cough.  He was recently treated with antibiotics by his PCP along with a steroid taper but it did not help.  Chest x-ray showed increased opacification overlying the medial right lower lung scarring, representing aspiration or pneumonia.  Patient was admitted for IV antibiotics.  Assessment & Plan:  Principal Problem:   Acute exacerbation of chronic obstructive pulmonary disease (COPD) (HCC)  Acute on chronic hypoxic respiratory failure: Secondary to COPD, concurrent pneumonia.  On 2 L of oxygen at home.  Currently on 3 L.  Will try to wean.  Still has bilateral expiratory wheezing.  Will check ABG  Community-acquired pneumonia:Chest x-ray showed increased opacification overlying the medial right lower lung scarring, representing aspiration or pneumonia.  Flu/RSV/COVID-negative.  Rhinovirus positive.  Continue current antibiotics.  Follow-up blood cultures.  Negative urine strep antigen  COPD exacerbation: Secondary to pneumonia.  Continue steroids, bronchodilators, incentive spirometry.  Past smoker  Normocytic anemia: Currently hemoglobin stable.  Continue to monitor  Hypokalemia: Supplemented and corrected  Neuropathy: On gabapentin at home.  Continue  Hyponatremia: Likely secondary to dehydration.  Continue gentle IV fluids  Cocaine abuse: Counseled cessation.  History of lung cancer: Currently in remission, status post resection.  Follows with pulmonology at [facility]         DVT prophylaxis:enoxaparin (LOVENOX) injection 40 mg Start: 04/06/22 2000 SCDs Start: 04/06/22 1847     Code Status: Full Code  Family Communication: None at  bedside  Patient status:Inpatient  Patient is from :Home  Anticipated discharge RC:[id number]  Estimated DC date:2-3 days   Consultants: None  Procedures:None  Antimicrobials:  Anti-infectives (From admission, onward)    Start     Dose/Rate Route Frequency Ordered Stop   04/06/22 2000  doxycycline (VIBRAMYCIN) 100 mg in sodium chloride 0.9 % 250 mL IVPB        100 mg 125 mL/hr over 120 Minutes Intravenous Every 12 hours 04/06/22 1846     04/06/22 1545  cefTRIAXone (ROCEPHIN) 2 g in sodium chloride 0.9 % 100 mL IVPB        2 g 200 mL/hr over 30 Minutes Intravenous  Once 04/06/22 1544 04/06/22 1620       Subjective: Patient seen and examined at bedside today.  Was wheezing.  Also appears slightly drowsy but alert and oriented.  Complains of shortness of breath.  On 3 L of oxygen.  Objective: Vitals:   04/06/22 2056 04/07/22 0130 04/07/22 0522 04/07/22 0620  BP:   (!) 129/90   Pulse:   85   Resp:   18   Temp:   (!) 97.3 F (36.3 C)   TempSrc:   Oral   SpO2: 96% 96% 97% 96%  Weight:      Height:        Intake/Output Summary (Last 24 hours) at 04/07/2022 [id number] Last data filed at 04/07/2022 0500 Gross per 24 hour  Intake 602.65 ml  Output 200 ml  Net 402.65 ml   Filed Weights   04/06/22 1250  Weight: 65 kg    Examination:  General exam: Overall comfortable, not in distress HEENT: PERRL Respiratory system: Bilateral expiratory wheezing Cardiovascular system: S1 & S2 heard, RRR.  Gastrointestinal system: Abdomen is nondistended, soft  and nontender. Central nervous system: Alert and oriented Extremities: No edema, no clubbing ,no cyanosis Skin: No rashes, no ulcers,no icterus     Data Reviewed: I have personally reviewed following labs and imaging studies  CBC: Recent Labs  Lab 04/06/22 1322 04/07/22 0503  WBC 6.6 5.0  NEUTROABS 5.0 PENDING  HGB 12.9* 12.1*  HCT 39.0 36.1*  MCV 93.1 92.8  PLT 383 0000000   Basic Metabolic Panel: Recent Labs  Lab  04/06/22 1322 04/07/22 0503  NA 133* 134*  K 3.3* 4.2  CL 98 100  CO2 27 27  GLUCOSE 104* 150*  BUN 12 13  CREATININE 0.72 0.71  CALCIUM 8.6* 8.9  MG  --  2.6*  PHOS  --  2.9     Recent Results (from the past 240 hour(s))  Resp panel by RT-PCR (RSV, Flu A&B, Covid) Anterior Nasal Swab     Status: None   Collection Time: 04/06/22 12:56 PM   Specimen: Anterior Nasal Swab  Result Value Ref Range Status   SARS Coronavirus 2 by RT PCR NEGATIVE NEGATIVE Final    Comment: (NOTE) SARS-CoV-2 target nucleic acids are NOT DETECTED.  The SARS-CoV-2 RNA is generally detectable in upper respiratory specimens during the acute phase of infection. The lowest concentration of SARS-CoV-2 viral copies this assay can detect is 138 copies/mL. A negative result does not preclude SARS-Cov-2 infection and should not be used as the sole basis for treatment or other patient management decisions. A negative result may occur with  improper specimen collection/handling, submission of specimen other than nasopharyngeal swab, presence of viral mutation(s) within the areas targeted by this assay, and inadequate number of viral copies(<138 copies/mL). A negative result must be combined with clinical observations, patient history, and epidemiological information. The expected result is Negative.  Fact Sheet for Patients:  EntrepreneurPulse.com.au  Fact Sheet for Healthcare Providers:  IncredibleEmployment.be  This test is no t yet approved or cleared by the [place] FDA and  has been authorized for detection and/or diagnosis of SARS-CoV-2 by FDA under an Emergency Use Authorization (EUA). This EUA will remain  in effect (meaning this test can be used) for the duration of the COVID-19 declaration under Section 564(b)(1) of the Act, 21 U.S.C.section 360bbb-3(b)(1), unless the authorization is terminated  or revoked sooner.       Influenza A by PCR NEGATIVE  NEGATIVE Final   Influenza B by PCR NEGATIVE NEGATIVE Final    Comment: (NOTE) The Xpert Xpress SARS-CoV-2/FLU/RSV plus assay is intended as an aid in the diagnosis of influenza from Nasopharyngeal swab specimens and should not be used as a sole basis for treatment. Nasal washings and aspirates are unacceptable for Xpert Xpress SARS-CoV-2/FLU/RSV testing.  Fact Sheet for Patients: EntrepreneurPulse.com.au  Fact Sheet for Healthcare Providers: IncredibleEmployment.be  This test is not yet approved or cleared by the [place] FDA and has been authorized for detection and/or diagnosis of SARS-CoV-2 by FDA under an Emergency Use Authorization (EUA). This EUA will remain in effect (meaning this test can be used) for the duration of the COVID-19 declaration under Section 564(b)(1) of the Act, 21 U.S.C. section 360bbb-3(b)(1), unless the authorization is terminated or revoked.     Resp Syncytial Virus by PCR NEGATIVE NEGATIVE Final    Comment: (NOTE) Fact Sheet for Patients: EntrepreneurPulse.com.au  Fact Sheet for Healthcare Providers: IncredibleEmployment.be  This test is not yet approved or cleared by the [place] FDA and has been authorized for detection and/or diagnosis of SARS-CoV-2 by  FDA under an Emergency Use Authorization (EUA). This EUA will remain in effect (meaning this test can be used) for the duration of the COVID-19 declaration under Section 564(b)(1) of the Act, 21 U.S.C. section 360bbb-3(b)(1), unless the authorization is terminated or revoked.  Performed at [facility], [place] [address] [place], [place] [address]   Blood culture (routine x 2)     Status: None (Preliminary result)   Collection Time: 04/06/22  5:09 PM   Specimen: BLOOD  Result Value Ref Range Status   Specimen Description   Final    BLOOD BLOOD LEFT ARM Performed at [facility] [address] [place], [place] [address]    Special Requests   Final    Blood Culture adequate volume Performed at [facility] [address] [place], [place] [address]    Culture   Final    NO GROWTH < 12 HOURS Performed at [facility] [address] [place], [place] [address]    Report Status PENDING  Incomplete  Blood culture (routine x 2)     Status: None (Preliminary result)   Collection Time: 04/06/22  5:09 PM   Specimen: BLOOD  Result Value Ref Range Status   Specimen Description   Final    BLOOD BLOOD RIGHT ARM Performed at [facility] [address] [place], [place] [address]    Special Requests   Final    Blood Culture adequate volume Performed at [place] [facility] [address] [place], [place] [address]    Culture   Final    NO GROWTH < 12 HOURS Performed at [facility] [address] [place], [place] [address]    Report Status PENDING  Incomplete  Respiratory (~20 pathogens) panel by PCR     Status: Abnormal   Collection Time: 04/06/22  9:52 PM   Specimen: Nasopharyngeal Swab; Respiratory  Result Value Ref Range Status   Adenovirus NOT DETECTED NOT DETECTED Final   Coronavirus 229E NOT DETECTED NOT DETECTED Final    Comment: (NOTE) The Coronavirus on the Respiratory Panel, DOES NOT test for the novel  Coronavirus (2019 nCoV)    Coronavirus HKU1 NOT DETECTED NOT DETECTED Final   Coronavirus NL63 NOT DETECTED NOT DETECTED Final   Coronavirus OC43 NOT DETECTED NOT DETECTED Final   Metapneumovirus NOT DETECTED NOT DETECTED Final   Rhinovirus / Enterovirus DETECTED (A) NOT DETECTED Final   Influenza A NOT DETECTED NOT DETECTED Final   Influenza B NOT DETECTED NOT DETECTED Final   Parainfluenza Virus 1 NOT DETECTED NOT DETECTED Final   Parainfluenza Virus 2 NOT DETECTED NOT DETECTED Final   Parainfluenza Virus 3 NOT DETECTED NOT DETECTED Final   Parainfluenza Virus 4 NOT  DETECTED NOT DETECTED Final   Respiratory Syncytial Virus NOT DETECTED NOT DETECTED Final   Bordetella pertussis NOT DETECTED NOT DETECTED Final   Bordetella Parapertussis NOT DETECTED NOT DETECTED Final   Chlamydophila pneumoniae NOT DETECTED NOT DETECTED Final   Mycoplasma pneumoniae NOT DETECTED NOT DETECTED Final    Comment: Performed at [facility], [place]. [address], [place], [place] [address]     Radiology Studies: DG Chest Port 1 View  Result Date: 04/06/2022 CLINICAL DATA:  Shortness of breath.  COPD. EXAM: PORTABLE CHEST 1 VIEW COMPARISON:  Chest radiographs 07/21/2021 and 02/01/2021; CT chest 07/23/2018 FINDINGS: Cardiac silhouette and mediastinal contours are within normal limits. There is again a chronic posterior right lower  lobe linear band of scarring. Compared to the most recent 07/21/2021 radiographs, there is increased opacification overlying the medial right lower lung scarring, which may represent aspiration or pneumonia. There are lucencies consistent with paraseptal emphysematous changes and bullae predominantly within the superomedial right lung in the right-greater-than-left inferior lungs. No pleural effusion or pneumothorax.  No acute skeletal abnormality. IMPRESSION: 1. Compared to the most recent 07/21/2021 radiographs, there is increased opacification overlying the medial right lower lung scarring, which may represent aspiration or pneumonia. 2. Chronic posterior right lower lobe linear band of scarring. 3. Chronic paraseptal emphysematous changes and bullae predominantly within the superomedial right lung. Electronically Signed   By: Yvonne Kendall M.D.   On: 04/06/2022 14:22    Scheduled Meds:  arformoterol  15 mcg Nebulization BID   budesonide (PULMICORT) nebulizer solution  0.25 mg Nebulization BID   enoxaparin (LOVENOX) injection  40 mg Subcutaneous Q24H   fluticasone  1 spray Each Nare Daily   gabapentin  300 mg Oral QHS   guaiFENesin  1,200 mg Oral BID    ibuprofen  800 mg Oral BID   ipratropium  0.5 mg Nebulization Q6H   levalbuterol  0.63 mg Nebulization Q6H   methylPREDNISolone sodium succinate  60 mg Intravenous Q12H   Continuous Infusions:  sodium chloride 75 mL/hr at 04/06/22 1948   doxycycline (VIBRAMYCIN) IV 100 mg (04/06/22 2000)     LOS: 1 day   Shelly Coss, MD [facility] P2/25/2024, 7:42 AM

## 2022-04-08 DIAGNOSIS — J441 Chronic obstructive pulmonary disease with (acute) exacerbation: Secondary | ICD-10-CM | POA: Diagnosis not present

## 2022-04-08 LAB — GLUCOSE, CAPILLARY: Glucose-Capillary: 115 mg/dL — ABNORMAL HIGH (ref 70–99)

## 2022-04-08 MED ORDER — AMOXICILLIN-POT CLAVULANATE 875-125 MG PO TABS: 1.0000 | ORAL_TABLET | Freq: Two times a day (BID) | ORAL | 0 refills | Status: AC

## 2022-04-08 MED ORDER — IPRATROPIUM-ALBUTEROL 0.5-2.5 (3) MG/3ML IN SOLN: 3.0000 mL | Freq: Four times a day (QID) | RESPIRATORY_TRACT | 0 refills | Status: DC | PRN

## 2022-04-08 MED ORDER — IBUPROFEN 800 MG PO TABS: 800.0000 mg | ORAL_TABLET | Freq: Two times a day (BID) | ORAL | 0 refills | Status: AC | PRN

## 2022-04-08 MED ORDER — TIOTROPIUM BROMIDE-OLODATEROL 2.5-2.5 MCG/ACT IN AERS: 2.0000 | INHALATION_SPRAY | Freq: Every morning | RESPIRATORY_TRACT | 1 refills | Status: DC

## 2022-04-08 MED ORDER — ALUM & MAG HYDROXIDE-SIMETH 200-200-20 MG/5ML PO SUSP
30.0000 mL | Freq: Four times a day (QID) | ORAL | Status: DC | PRN
  Administered 2022-04-08: 30 mL via ORAL
  Filled 2022-04-08 (×2): qty 30

## 2022-04-08 MED ORDER — ALBUTEROL SULFATE HFA 108 (90 BASE) MCG/ACT IN AERS: 2.0000 | INHALATION_SPRAY | Freq: Four times a day (QID) | RESPIRATORY_TRACT | 1 refills | Status: AC | PRN

## 2022-04-08 MED ORDER — AMOXICILLIN-POT CLAVULANATE 875-125 MG PO TABS
1.0000 | ORAL_TABLET | Freq: Two times a day (BID) | ORAL | Status: DC
  Administered 2022-04-08: 1 via ORAL
  Filled 2022-04-08: qty 1

## 2022-04-08 MED ORDER — PREDNISONE 10 MG PO TABS: 10.0000 mg | ORAL_TABLET | Freq: Every day | ORAL | 0 refills | Status: DC

## 2022-04-08 NOTE — Discharge Summary (Signed)
Physician Discharge Summary  Shandon Leys [id number] DOB: 05/11/1965 DOA: 04/06/2022  PCP: [facility]  Admit date: 04/06/2022 Discharge date: 04/08/2022  Admitted From: Home Disposition:  Home  Discharge Condition:Stable CODE STATUS:FULL Diet recommendation: Regular   Brief/Interim Summary: Patient is a 57 year old male with history of COPD, chronic hypoxic respiratory failure on 2L  oxygen at home, neuropathy, tobacco abuse, cocaine abuse who presents with worsening dyspnea for 2 weeks, productive cough.  He was recently treated with antibiotics by his PCP along with a steroid taper but it did not help.  Chest x-ray showed increased opacification overlying the medial right lower lung scarring, representing aspiration or pneumonia.  Patient was admitted for IV antibiotics.  Patient was started on IV steroids, bronchodilators.  Now he has been weaned to his usual oxygen requirement.  Feels much better today and wants to go home.  Medically stable for discharge today  Following problems were addressed during the hospitalization:  Acute on chronic hypoxic respiratory failure: Secondary to COPD, concurrent pneumonia.  On 2 L of oxygen at home,weaned to the same.    Community-acquired pneumonia:Chest x-ray showed increased opacification overlying the medial right lower lung scarring, representing aspiration or pneumonia.  Flu/RSV/COVID-negative.  Rhinovirus positive.  Continue current antibiotics.     COPD exacerbation: Secondary to pneumonia.  Past smoker.  Continue bronchodilators, prn nebulization at home.  [facility]   Normocytic anemia: Currently hemoglobin stable.  Continue to monitor   Hypokalemia: Supplemented and corrected   Neuropathy: On gabapentin at home.  Continue   Hyponatremia: Likely secondary to dehydration. Improved   Cocaine abuse: Counseled cessation.   History of lung cancer: Currently in remission, status post resection.  Follows  with pulmonology at [facility]   Discharge Diagnoses:  Principal Problem:   Acute exacerbation of chronic obstructive pulmonary disease (COPD) [facility])    Discharge Instructions  Discharge Instructions     Diet general   Complete by: As directed    Discharge instructions   Complete by: As directed    1)Please take prescribed medications as instructed 2)Follow up with your PCP in a week.  Follow-up with your pulmonologist 3)Stop cocaine intake   Increase activity slowly   Complete by: As directed       Allergies as of 04/08/2022   No Known Allergies      Medication List     STOP taking these medications    azithromycin 250 MG tablet Commonly known as: ZITHROMAX       TAKE these medications    albuterol 108 (90 Base) MCG/ACT inhaler Commonly known as: VENTOLIN HFA Inhale 2 puffs into the lungs every 6 (six) hours as needed for wheezing or shortness of breath (cough). What changed:  when to take this Another medication with the same name was removed. Continue taking this medication, and follow the directions you see here.   amoxicillin-clavulanate 875-125 MG tablet Commonly known as: AUGMENTIN Take 1 tablet by mouth every 12 (twelve) hours for 3 days.   cyclobenzaprine 10 MG tablet Commonly known as: FLEXERIL Take 1 tablet by mouth at bedtime as needed for muscle spasms.   fluticasone 50 MCG/ACT nasal spray Commonly known as: Flonase Place 1 spray into both nostrils daily.   gabapentin 300 MG capsule Commonly known as: NEURONTIN Take 300 mg by mouth at bedtime.   ibuprofen 800 MG tablet Commonly known as: ADVIL Take 1 tablet (800 mg total) by mouth every 12 (twelve) hours as needed. What changed:  when to  take this reasons to take this   ipratropium-albuterol 0.5-2.5 (3) MG/3ML Soln Commonly known as: DUONEB Take 3 mLs by nebulization every 6 (six) hours as needed.   mometasone 220 MCG/INH inhaler Commonly known as: ASMANEX Inhale 2 puffs into the lungs  at bedtime.   predniSONE 10 MG tablet Commonly known as: DELTASONE Take 1 tablet (10 mg total) by mouth daily. Take 4 pills daily for 3 days then 3 pills daily for 3 days then 2 pills daily for 3 days then 1 pill daily for 3 days then stop What changed:  how much to take how to take this when to take this additional instructions   Tiotropium Bromide-Olodaterol 2.5-2.5 MCG/ACT Aers Inhale 2 puffs into the lungs in the morning.        Follow-up Information     [facility], [facility]. Schedule an appointment as soon as possible for a visit in 1 week(s).   Contact information: [place] [address] [phone]                No Known Allergies  Consultations: None   Procedures/Studies: DG Chest Port 1 View  Result Date: 04/06/2022 CLINICAL DATA:  Shortness of breath.  COPD. EXAM: PORTABLE CHEST 1 VIEW COMPARISON:  Chest radiographs 07/21/2021 and 02/01/2021; CT chest 07/23/2018 FINDINGS: Cardiac silhouette and mediastinal contours are within normal limits. There is again a chronic posterior right lower lobe linear band of scarring. Compared to the most recent 07/21/2021 radiographs, there is increased opacification overlying the medial right lower lung scarring, which may represent aspiration or pneumonia. There are lucencies consistent with paraseptal emphysematous changes and bullae predominantly within the superomedial right lung in the right-greater-than-left inferior lungs. No pleural effusion or pneumothorax.  No acute skeletal abnormality. IMPRESSION: 1. Compared to the most recent 07/21/2021 radiographs, there is increased opacification overlying the medial right lower lung scarring, which may represent aspiration or pneumonia. 2. Chronic posterior right lower lobe linear band of scarring. 3. Chronic paraseptal emphysematous changes and bullae predominantly within the superomedial right lung. Electronically Signed   By: Yvonne Kendall  M.D.   On: 04/06/2022 14:22      Subjective: Patient seen and examined at bedside today.  Feels much better today.  On 2 L of oxygen per minute.  Cough is better.  He wants to go home  Discharge Exam: Vitals:   04/08/22 0638 04/08/22 0752  BP: (!) 135/90   Pulse: 94   Resp: 18   Temp: (!) 97.5 F (36.4 C)   SpO2: 97% 95%   Vitals:   04/07/22 1929 04/07/22 2003 04/08/22 0638 04/08/22 0752  BP: (!) 129/92  (!) 135/90   Pulse: 100  94   Resp: 18  18   Temp: (!) 97.5 F (36.4 C)  (!) 97.5 F (36.4 C)   TempSrc: Oral  Oral   SpO2: 97% 94% 97% 95%  Weight:      Height:        General: Pt is alert, awake, not in acute distress Cardiovascular: RRR, S1/S2 +, no rubs, no gallops Respiratory: Mild expiratory bilateral wheezing, decreased air sounds Abdominal: Soft, NT, ND, bowel sounds + Extremities: no edema, no cyanosis    The results of significant diagnostics from this hospitalization (including imaging, microbiology, ancillary and laboratory) are listed below for reference.     Microbiology: Recent Results (from the past 240 hour(s))  Resp panel by RT-PCR (RSV, Flu A&B, Covid) Anterior Nasal Swab  Status: None   Collection Time: 04/06/22 12:56 PM   Specimen: Anterior Nasal Swab  Result Value Ref Range Status   SARS Coronavirus 2 by RT PCR NEGATIVE NEGATIVE Final    Comment: (NOTE) SARS-CoV-2 target nucleic acids are NOT DETECTED.  The SARS-CoV-2 RNA is generally detectable in upper respiratory specimens during the acute phase of infection. The lowest concentration of SARS-CoV-2 viral copies this assay can detect is 138 copies/mL. A negative result does not preclude SARS-Cov-2 infection and should not be used as the sole basis for treatment or other patient management decisions. A negative result may occur with  improper specimen collection/handling, submission of specimen other than nasopharyngeal swab, presence of viral mutation(s) within the areas targeted  by this assay, and inadequate number of viral copies(<138 copies/mL). A negative result must be combined with clinical observations, patient history, and epidemiological information. The expected result is Negative.  Fact Sheet for Patients:  EntrepreneurPulse.com.au  Fact Sheet for Healthcare Providers:  IncredibleEmployment.be  This test is no t yet approved or cleared by the [place] FDA and  has been authorized for detection and/or diagnosis of SARS-CoV-2 by FDA under an Emergency Use Authorization (EUA). This EUA will remain  in effect (meaning this test can be used) for the duration of the COVID-19 declaration under Section 564(b)(1) of the Act, 21 U.S.C.section 360bbb-3(b)(1), unless the authorization is terminated  or revoked sooner.       Influenza A by PCR NEGATIVE NEGATIVE Final   Influenza B by PCR NEGATIVE NEGATIVE Final    Comment: (NOTE) The Xpert Xpress SARS-CoV-2/FLU/RSV plus assay is intended as an aid in the diagnosis of influenza from Nasopharyngeal swab specimens and should not be used as a sole basis for treatment. Nasal washings and aspirates are unacceptable for Xpert Xpress SARS-CoV-2/FLU/RSV testing.  Fact Sheet for Patients: EntrepreneurPulse.com.au  Fact Sheet for Healthcare Providers: IncredibleEmployment.be  This test is not yet approved or cleared by the [place] FDA and has been authorized for detection and/or diagnosis of SARS-CoV-2 by FDA under an Emergency Use Authorization (EUA). This EUA will remain in effect (meaning this test can be used) for the duration of the COVID-19 declaration under Section 564(b)(1) of the Act, 21 U.S.C. section 360bbb-3(b)(1), unless the authorization is terminated or revoked.     Resp Syncytial Virus by PCR NEGATIVE NEGATIVE Final    Comment: (NOTE) Fact Sheet for Patients: EntrepreneurPulse.com.au  Fact  Sheet for Healthcare Providers: IncredibleEmployment.be  This test is not yet approved or cleared by the [place] FDA and has been authorized for detection and/or diagnosis of SARS-CoV-2 by FDA under an Emergency Use Authorization (EUA). This EUA will remain in effect (meaning this test can be used) for the duration of the COVID-19 declaration under Section 564(b)(1) of the Act, 21 U.S.C. section 360bbb-3(b)(1), unless the authorization is terminated or revoked.  Performed at [facility], [place] [address] [place], [place] [address]   Blood culture (routine x 2)     Status: None (Preliminary result)   Collection Time: 04/06/22  5:09 PM   Specimen: BLOOD LEFT ARM  Result Value Ref Range Status   Specimen Description   Final    BLOOD LEFT ARM Performed at [facility], [place] [address] [place], [place] [address]    Special Requests   Final    Blood Culture adequate volume Performed at [facility] [address], [place],  [address]    Culture   Final  NO GROWTH < 12 HOURS Performed at [facility] [address], [place], [place] [address]    Report Status PENDING  Incomplete  Blood culture (routine x 2)     Status: None (Preliminary result)   Collection Time: 04/06/22  5:09 PM   Specimen: BLOOD RIGHT ARM  Result Value Ref Range Status   Specimen Description   Final    BLOOD RIGHT ARM Performed at [facility], [place] [address], [place], [place] [address]    Special Requests   Final    Blood Culture adequate volume Performed at [facility] [address] [place], [place] [address]    Culture   Final    NO GROWTH < 12 HOURS Performed at [facility] [address] [place], [place] [address]    Report Status PENDING  Incomplete  Respiratory (~20 pathogens) panel by PCR     Status: Abnormal   Collection Time: 04/06/22  9:52 PM   Specimen: Nasopharyngeal  Swab; Respiratory  Result Value Ref Range Status   Adenovirus NOT DETECTED NOT DETECTED Final   Coronavirus 229E NOT DETECTED NOT DETECTED Final    Comment: (NOTE) The Coronavirus on the Respiratory Panel, DOES NOT test for the novel  Coronavirus (2019 nCoV)    Coronavirus HKU1 NOT DETECTED NOT DETECTED Final   Coronavirus NL63 NOT DETECTED NOT DETECTED Final   Coronavirus OC43 NOT DETECTED NOT DETECTED Final   Metapneumovirus NOT DETECTED NOT DETECTED Final   Rhinovirus / Enterovirus DETECTED (A) NOT DETECTED Final   Influenza A NOT DETECTED NOT DETECTED Final   Influenza B NOT DETECTED NOT DETECTED Final   Parainfluenza Virus 1 NOT DETECTED NOT DETECTED Final   Parainfluenza Virus 2 NOT DETECTED NOT DETECTED Final   Parainfluenza Virus 3 NOT DETECTED NOT DETECTED Final   Parainfluenza Virus 4 NOT DETECTED NOT DETECTED Final   Respiratory Syncytial Virus NOT DETECTED NOT DETECTED Final   Bordetella pertussis NOT DETECTED NOT DETECTED Final   Bordetella Parapertussis NOT DETECTED NOT DETECTED Final   Chlamydophila pneumoniae NOT DETECTED NOT DETECTED Final   Mycoplasma pneumoniae NOT DETECTED NOT DETECTED Final    Comment: Performed at [facility], [place]. [address] [place], [place] [address]     Labs: BNP (last 3 results) Recent Labs    07/21/21 2035 04/07/22 0503  BNP 19.1 [id number]   Basic Metabolic Panel: Recent Labs  Lab 04/06/22 1322 04/07/22 0503  NA 133* 134*  K 3.3* 4.2  CL 98 100  CO2 27 27  GLUCOSE 104* 150*  BUN 12 13  CREATININE 0.72 0.71  CALCIUM 8.6* 8.9  MG  --  2.6*  PHOS  --  2.9   Liver Function Tests: Recent Labs  Lab 04/07/22 0503  AST 19  ALT 12  ALKPHOS 49  BILITOT 0.5  PROT 6.9  ALBUMIN 3.2*   No results for input(s): "LIPASE", "AMYLASE" in the last 168 hours. No results for input(s): "AMMONIA" in the last 168 hours. CBC: Recent Labs  Lab 04/06/22 1322 04/07/22 0503  WBC 6.6 5.0  NEUTROABS 5.0 4.5  HGB 12.9* 12.1*  HCT  39.0 36.1*  MCV 93.1 92.8  PLT 383 370   Cardiac Enzymes: No results for input(s): "CKTOTAL", "CKMB", "CKMBINDEX", "TROPONINI" in the last 168 hours. BNP: Invalid input(s): "POCBNP" CBG: Recent Labs  Lab 04/07/22 0734 04/08/22 0746  GLUCAP 147* 115*   D-Dimer Recent Labs    04/06/22 2216  DDIMER  0.40   Hgb A1c No results for input(s): "HGBA1C" in the last 72 hours. Lipid Profile No results for input(s): "CHOL", "HDL", "LDLCALC", "TRIG", "CHOLHDL", "LDLDIRECT" in the last 72 hours. Thyroid function studies Recent Labs    04/07/22 0503  TSH 0.184*   Anemia work up No results for input(s): "VITAMINB12", "FOLATE", "FERRITIN", "TIBC", "IRON", "RETICCTPCT" in the last 72 hours. Urinalysis No results found for: "COLORURINE", "APPEARANCEUR", "LABSPEC", "PHURINE", "GLUCOSEU", "HGBUR", "BILIRUBINUR", "KETONESUR", "PROTEINUR", "UROBILINOGEN", "NITRITE", "LEUKOCYTESUR" Sepsis Labs Recent Labs  Lab 04/06/22 1322 04/07/22 0503  WBC 6.6 5.0   Microbiology Recent Results (from the past 240 hour(s))  Resp panel by RT-PCR (RSV, Flu A&B, Covid) Anterior Nasal Swab     Status: None   Collection Time: 04/06/22 12:56 PM   Specimen: Anterior Nasal Swab  Result Value Ref Range Status   SARS Coronavirus 2 by RT PCR NEGATIVE NEGATIVE Final    Comment: (NOTE) SARS-CoV-2 target nucleic acids are NOT DETECTED.  The SARS-CoV-2 RNA is generally detectable in upper respiratory specimens during the acute phase of infection. The lowest concentration of SARS-CoV-2 viral copies this assay can detect is 138 copies/mL. A negative result does not preclude SARS-Cov-2 infection and should not be used as the sole basis for treatment or other patient management decisions. A negative result may occur with  improper specimen collection/handling, submission of specimen other than nasopharyngeal swab, presence of viral mutation(s) within the areas targeted by this assay, and inadequate number of  viral copies(<138 copies/mL). A negative result must be combined with clinical observations, patient history, and epidemiological information. The expected result is Negative.  Fact Sheet for Patients:  EntrepreneurPulse.com.au  Fact Sheet for Healthcare Providers:  IncredibleEmployment.be  This test is no t yet approved or cleared by the [place] FDA and  has been authorized for detection and/or diagnosis of SARS-CoV-2 by FDA under an Emergency Use Authorization (EUA). This EUA will remain  in effect (meaning this test can be used) for the duration of the COVID-19 declaration under Section 564(b)(1) of the Act, 21 U.S.C.section 360bbb-3(b)(1), unless the authorization is terminated  or revoked sooner.       Influenza A by PCR NEGATIVE NEGATIVE Final   Influenza B by PCR NEGATIVE NEGATIVE Final    Comment: (NOTE) The Xpert Xpress SARS-CoV-2/FLU/RSV plus assay is intended as an aid in the diagnosis of influenza from Nasopharyngeal swab specimens and should not be used as a sole basis for treatment. Nasal washings and aspirates are unacceptable for Xpert Xpress SARS-CoV-2/FLU/RSV testing.  Fact Sheet for Patients: EntrepreneurPulse.com.au  Fact Sheet for Healthcare Providers: IncredibleEmployment.be  This test is not yet approved or cleared by the [place] FDA and has been authorized for detection and/or diagnosis of SARS-CoV-2 by FDA under an Emergency Use Authorization (EUA). This EUA will remain in effect (meaning this test can be used) for the duration of the COVID-19 declaration under Section 564(b)(1) of the Act, 21 U.S.C. section 360bbb-3(b)(1), unless the authorization is terminated or revoked.     Resp Syncytial Virus by PCR NEGATIVE NEGATIVE Final    Comment: (NOTE) Fact Sheet for Patients: EntrepreneurPulse.com.au  Fact Sheet for Healthcare  Providers: IncredibleEmployment.be  This test is not yet approved or cleared by the [place] FDA and has been authorized for detection and/or diagnosis of SARS-CoV-2 by FDA under an Emergency Use Authorization (EUA). This EUA will remain in effect (meaning this test can be used) for the duration of the COVID-19 declaration under Section 564(b)(1) of the Act,  21 U.S.C. section 360bbb-3(b)(1), unless the authorization is terminated or revoked.  Performed at [facility], [place] [address] [place], [place] [address]   Blood culture (routine x 2)     Status: None (Preliminary result)   Collection Time: 04/06/22  5:09 PM   Specimen: BLOOD LEFT ARM  Result Value Ref Range Status   Specimen Description   Final    BLOOD LEFT ARM Performed at [facility], [place] [address], [place], [place] [address]    Special Requests   Final    Blood Culture adequate volume Performed at [facility] [address], [place], [place] [address]    Culture   Final    NO GROWTH < 12 HOURS Performed at [facility] [address], [place], [place] [address]    Report Status PENDING  Incomplete  Blood culture (routine x 2)     Status: None (Preliminary result)   Collection Time: 04/06/22  5:09 PM   Specimen: BLOOD RIGHT ARM  Result Value Ref Range Status   Specimen Description   Final    BLOOD RIGHT ARM Performed at [facility], [place] [address] [place], [place] [address]    Special Requests   Final    Blood Culture adequate volume Performed at [facility] [address] [place], [place] [address]    Culture   Final    NO GROWTH < 12 HOURS Performed at [facility] [address] [place], [place] [address]    Report Status PENDING  Incomplete  Respiratory (~20 pathogens) panel by PCR     Status: Abnormal   Collection Time: 04/06/22  9:52 PM   Specimen: Nasopharyngeal Swab; Respiratory   Result Value Ref Range Status   Adenovirus NOT DETECTED NOT DETECTED Final   Coronavirus 229E NOT DETECTED NOT DETECTED Final    Comment: (NOTE) The Coronavirus on the Respiratory Panel, DOES NOT test for the novel  Coronavirus (2019 nCoV)    Coronavirus HKU1 NOT DETECTED NOT DETECTED Final   Coronavirus NL63 NOT DETECTED NOT DETECTED Final   Coronavirus OC43 NOT DETECTED NOT DETECTED Final   Metapneumovirus NOT DETECTED NOT DETECTED Final   Rhinovirus / Enterovirus DETECTED (A) NOT DETECTED Final   Influenza A NOT DETECTED NOT DETECTED Final   Influenza B NOT DETECTED NOT DETECTED Final   Parainfluenza Virus 1 NOT DETECTED NOT DETECTED Final   Parainfluenza Virus 2 NOT DETECTED NOT DETECTED Final   Parainfluenza Virus 3 NOT DETECTED NOT DETECTED Final   Parainfluenza Virus 4 NOT DETECTED NOT DETECTED Final   Respiratory Syncytial Virus NOT DETECTED NOT DETECTED Final   Bordetella pertussis NOT DETECTED NOT DETECTED Final   Bordetella Parapertussis NOT DETECTED NOT DETECTED Final   Chlamydophila pneumoniae NOT DETECTED NOT DETECTED Final   Mycoplasma pneumoniae NOT DETECTED NOT DETECTED Final    Comment: Performed at [facility], [place]. [address], [place],  [address]    Please note: You were cared for by a hospitalist during your hospital stay. Once you are discharged, your primary care physician will handle any further medical issues. Please note that NO REFILLS for any discharge medications will be authorized once you are discharged, as it is imperative that you return to your primary care physician (or establish a relationship with a primary care physician if you do not have one) for your post hospital discharge needs so that they can reassess your need for  medications and monitor your lab values.    Time coordinating discharge: 40 minutes  SIGNED:   Shelly Coss, MD  [facility] 04/08/2022, 10:41 AM Pager [phone]  If 7PM-7AM, please contact  night-coverage [email or web address] Password [email or web address]

## 2022-04-08 NOTE — TOC Transition Note (Signed)
Transition of Care [facility]) - CM/SW Discharge Note   Patient Details  Name: Jesse Powell MRN: [id number] Date of Birth: January 05, 1966  [facility] CM/SW Contact:  Vassie Moselle, LCSW Phone Number: 04/08/2022, 11:07 AM   Clinical Narrative:    Pt is to return home with home health nursing through [facility]. Pt on 2L O2 at baseline. No TOC needs identified.    Final next level of care: Home w Home Health Services Barriers to Discharge: No Barriers Identified   Patient Goals and CMS Choice CMS Medicare.gov Compare Post Acute Care list provided to:: Patient Choice offered to / list presented to : Patient  Discharge Placement                         Discharge Plan and Services Additional resources added to the After Visit Summary for       Post Acute Care Choice: Home Health          DME Arranged: N/A DME Agency: NA                  Social Determinants of Health (SDOH) Interventions SDOH Screenings   Food Insecurity: No Food Insecurity (04/06/2022)  Housing: Low Risk  (04/06/2022)  Transportation Needs: No Transportation Needs (04/06/2022)  Utilities: Not At Risk (04/06/2022)  Tobacco Use: Medium Risk (04/06/2022)     Readmission Risk Interventions    04/08/2022   11:06 AM  Readmission Risk Prevention Plan  Post Dischage Appt Complete  Medication Screening Complete  Transportation Screening Complete

## 2022-04-09 LAB — LEGIONELLA PNEUMOPHILA SEROGP 1 UR AG: L. pneumophila Serogp 1 Ur Ag: NEGATIVE

## 2022-04-11 LAB — CULTURE, BLOOD (ROUTINE X 2)
Culture: NO GROWTH
Culture: NO GROWTH
Special Requests: ADEQUATE
Special Requests: ADEQUATE

## 2022-04-30 NOTE — Progress Notes (Signed)
NEW PATIENT Date of Service/Encounter:  05/01/22 Referring provider: [place], [place] Ryan, DO Primary care provider: [facility]  Subjective:  Jesse Powell is a 57 y.o. male with a PMHx of steroid-dependent COPD, cocaine abuse, Lung CA s/p lung nodule removed? 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  History obtained from: chart review and patient.   COPD:  He is currently taking stiolto, asmanex daily and albuterol as needed. Using daily. He is former smoker, never vaped. Quit 5 years ago. 1 ppd x 20 years. Newports. He reports girlfriend vapes but not around him. He is on 2L oxygen at baseline. Increases during flares. He is not sure who his lung doctor is . He is not sure about an injectable medication for astha/COPD.   He denies rhinoconjunctivitis. Denies prior ENT surgeries.  He has been hospitalized around 5 times in his lifetime for lung infections-pneumonias and COPD exacerbations.  He has had pneumonia 4 times. Denis sinus infections or any other infections not related to his lungs. His last hospitalization was 04/06/22. He is not sure if his vaccines are up-to-date.  He is unsure why he was sent-he believes to look for allergies. He does report concern for mold in his home. He is renting.  Chart Review:  -04/07/22: [facility] 200 -04/06/22: CXR: Compared to the most recent 07/21/2021 radiographs, there is increased opacification overlying the medial right lower lung scarring, which may represent aspiration or pneumonia. 2. Chronic posterior right lower lobe linear band of scarring. 3. Chronic paraseptal emphysematous changes and bullae predominantly within the superomedial right lung. -[facility] > 5 rounds systemic [facility] 2023 -[facility]: Asmanex Stiolto and ventolin -Recently hospitalized for COPD  exacerbation on 04/06/22. -2020 Pulmonary CTA: No filling defect is identified in the pulmonary arterial tree to suggest pulmonary embolus. 2. Patchy airspace opacities in both lung bases, aspiration pneumonitis or pneumonia not excluded. 3. Paraseptal emphysema and severe cystic bronchiectasis. 4. There is some bandlike densities in the right upper lobe with some nodular components which likely merit surveillance, including a 1.1 by 0.9 cm right upper lobe nodule on image 85/14. Consider surveillance chest CT in 3 months time to exclude the unlikely possibility of neoplastic process. 5. Aortic Atherosclerosis (ICD10-I70.0) and Emphysema (ICD10-J43.9). 2018 PET scan: Lung findings: Left upper lobe nodule measuring 1.4 x 0.8 cm demonstrates mild FDG uptake with maximal SUV of 1.38 (image 86). Likely linear scarring and/or subsegmental atelectasis involving both lower lobes with no malignant range FDG uptake. Substantial paraseptal emphysema.  No abnormal FDG uptake is seen in the heart, pleura, esophagus, hila/mediastinum, axilla, or breasts.    Other allergy screening: Food allergy: no Medication allergy: no Hymenoptera allergy: no Eczema:no  Past Medical History: Past Medical History:  Diagnosis Date   COPD (chronic obstructive pulmonary disease) (HCC)    Medication List:  Current Outpatient Medications  Medication Sig Dispense Refill   albuterol (VENTOLIN HFA) 108 (90 Base) MCG/ACT inhaler Inhale 2 puffs into the lungs every 6 (six) hours as needed for wheezing or shortness of breath (cough). 8 g 1   cyclobenzaprine (FLEXERIL) 10 MG tablet Take 1 tablet by mouth at bedtime as needed for muscle spasms.     doxycycline (VIBRA-TABS) 100 MG tablet Take 1 tablet by mouth 2 (two) times daily.     fluticasone (FLONASE) 50 MCG/ACT nasal spray Place 2 sprays into both nostrils daily.  gabapentin (NEURONTIN) 300 MG capsule Take 300 mg by mouth at bedtime.     guaifenesin (HUMIBID E) 400  MG TABS tablet TAKE ONE TABLET BY MOUTH EVERY 6 HOURS AS NEEDED FOR COUGH     ibuprofen (ADVIL) 800 MG tablet Take 1 tablet (800 mg total) by mouth every 12 (twelve) hours as needed. 30 tablet 0   ipratropium-albuterol (DUONEB) 0.5-2.5 (3) MG/3ML SOLN Take 3 mLs by nebulization every 6 (six) hours as needed. 360 mL 0   mometasone (ASMANEX) 220 MCG/INH inhaler Inhale 2 puffs into the lungs at bedtime.     predniSONE (DELTASONE) 10 MG tablet Take 1 tablet (10 mg total) by mouth daily. Take 4 pills daily for 3 days then 3 pills daily for 3 days then 2 pills daily for 3 days then 1 pill daily for 3 days then stop 30 tablet 0   Tiotropium Bromide-Olodaterol 2.5-2.5 MCG/ACT AERS Inhale 2 puffs into the lungs in the morning. 4 g 1   fluticasone (FLONASE) 50 MCG/ACT nasal spray Place 1 spray into both nostrils daily. 9.9 g 2   No current facility-administered medications for this visit.   Known Allergies:  No Known Allergies Past Surgical History: Past Surgical History:  Procedure Laterality Date   LUNG SURGERY  2019   Family History: No family history on file. Social History: Jesse Powell lives in a house bulit in [place], possible mildew, carpet floors, gas and electric heating, windows, no pets indoors, dog outdoors, + roaches, no DM protection, no HEPA filters.   ROS:  All other systems negative except as noted per HPI.  Objective:  Blood pressure 118/70, pulse (!) 101, temperature 97.9 F (36.6 C), temperature source Temporal, resp. rate 12, height 5' 8.5" (1.74 m), weight 151 lb 1.6 oz (68.5 kg), SpO2 95 %. Body mass index is 22.64 kg/m. Physical Exam:  General Appearance:  Alert, cooperative, no distress, appears stated age  Head:  Normocephalic, without obvious abnormality, atraumatic  Eyes:  Conjunctiva clear, EOM's intact  Nose: Nares normal,  nasal cannula in place and normal mucosa  Throat: Lips, tongue normal; teeth and gums normal, normal posterior oropharynx  Neck: Supple,  symmetrical  Lungs:   Rhonchi throughout , Respirations unlabored, no coughing  Heart:  regular rate and rhythm and no murmur, Appears well perfused  Extremities: No edema  Skin: Skin color, texture, turgor normal and no rashes or lesions on visualized portions of skin  Neurologic: No gross deficits     Diagnostics: Spirometry:  Tracings reviewed. His effort: It was hard to get consistent efforts and there is a question as to whether this reflects a maximal maneuver. FVC: 1.56L (pre), 1.84L  (post), + 17%, 280 mL change FEV1: 0.91L, 29.5% predicted (pre), 0.97L, 31% predicted (post), 6.59%, 60 mL FEV1/FVC ratio: 0.58 (pre), 0.53 (post) Interpretation:  mixed severe obstruction and restriction  with significant bronchodilator response in FVC Assessment and Plan  Astha/COPD-eosinophilic (elevated inflammatory cells):  - continue medications as prescribed by your lung doctor - consider dupixent which has been approved for eosinophilic asthma/showing good data in patients with eosinophilic COPD as well   - hand out given-he would like to read about it first then consider - continue to follow-up with your lung doctor  History of bronchiectasis:  - labs today to screen your immune system - labs to look at environmental allergens  Follow up : 6 months, sooner if needed He will call us sooner if he decides to pursue dupixent injections. It was a  pleasure meeting you in clinic today! Thank you for allowing me to participate in your care.  This note in its entirety was forwarded to the Provider who requested this consultation.  Thank you for your kind referral. I appreciate the opportunity to take part in Jesse Powell's care. Please do not hesitate to contact me with questions.  Sincerely,  Tonny Bollman, MD [facility]

## 2022-05-01 ENCOUNTER — Encounter: Payer: Self-pay | Admitting: Internal Medicine

## 2022-05-01 ENCOUNTER — Ambulatory Visit: Payer: No Typology Code available for payment source | Admitting: Internal Medicine

## 2022-05-01 ENCOUNTER — Other Ambulatory Visit: Payer: Self-pay

## 2022-05-01 VITALS — BP 118/70 | HR 101 | Temp 97.9°F | Resp 12 | Ht 68.5 in | Wt 151.1 lb

## 2022-05-01 DIAGNOSIS — B999 Unspecified infectious disease: Secondary | ICD-10-CM

## 2022-05-01 DIAGNOSIS — J449 Chronic obstructive pulmonary disease, unspecified: Secondary | ICD-10-CM

## 2022-05-01 DIAGNOSIS — J479 Bronchiectasis, uncomplicated: Secondary | ICD-10-CM | POA: Diagnosis not present

## 2022-05-01 DIAGNOSIS — J4489 Other specified chronic obstructive pulmonary disease: Secondary | ICD-10-CM | POA: Insufficient documentation

## 2022-05-01 NOTE — Patient Instructions (Addendum)
Astha/COPD-eosinophilic (elevated inflammatory cells):  - continue medications as prescribed by your lung doctor - consider dupixent which has been approved for eosinophilic asthma/showing good data in patients with eosinophilic COPD as well  - continue to follow-up with your lung doctor  History of bronchiectasis:  - labs today to screen your immune system - labs to look at environmental allergens  Follow up : 6 months, sooner if needed He will call us sooner if he decides to pursue dupixent injections. It was a pleasure meeting you in clinic today! Thank you for allowing me to participate in your care.  Sigurd Sos, MD [facility]

## 2022-05-08 LAB — ALLERGENS W/TOTAL IGE AREA 2
Alternaria Alternata IgE: <0.1 kU/L
Aspergillus Fumigatus IgE: 0.11 kU/L — AB
Bermuda Grass IgE: <0.1 kU/L
Cat Dander IgE: <0.1 kU/L
Cedar, Mountain IgE: <0.1 kU/L
Cladosporium Herbarum IgE: <0.1 kU/L
Cockroach, German IgE: <0.1 kU/L
Common Silver Birch IgE: <0.1 kU/L
Cottonwood IgE: <0.1 kU/L
D Farinae IgE: <0.1 kU/L
D Pteronyssinus IgE: <0.1 kU/L
Dog Dander IgE: <0.1 kU/L
Elm, American IgE: <0.1 kU/L
IgE (Immunoglobulin E), Serum: 159 IU/mL (ref 6–495)
Johnson Grass IgE: <0.1 kU/L
Maple/Box Elder IgE: <0.1 kU/L
Mouse Urine IgE: <0.1 kU/L
Oak, White IgE: <0.1 kU/L
Pecan, Hickory IgE: <0.1 kU/L
Penicillium Chrysogen IgE: <0.1 kU/L
Pigweed, Rough IgE: <0.1 kU/L
Ragweed, Short IgE: 10 kU/L — AB
Sheep Sorrel IgE Qn: <0.1 kU/L
Timothy Grass IgE: <0.1 kU/L
White Mulberry IgE: <0.1 kU/L

## 2022-05-08 LAB — STREP PNEUMONIAE 23 SEROTYPES IGG
Pneumo Ab Type 1*: 0.5 ug/mL — ABNORMAL LOW (ref >1.3)
Pneumo Ab Type 12 (12F)*: <0.1 ug/mL — ABNORMAL LOW (ref >1.3)
Pneumo Ab Type 14*: 4 ug/mL (ref >1.3)
Pneumo Ab Type 17 (17F)*: 1.4 ug/mL (ref >1.3)
Pneumo Ab Type 19 (19F)*: 1.9 ug/mL (ref >1.3)
Pneumo Ab Type 2*: 1 ug/mL — ABNORMAL LOW (ref >1.3)
Pneumo Ab Type 20*: 1.3 ug/mL — ABNORMAL LOW (ref >1.3)
Pneumo Ab Type 22 (22F)*: 1.2 ug/mL — ABNORMAL LOW (ref >1.3)
Pneumo Ab Type 23 (23F)*: 1.2 ug/mL — ABNORMAL LOW (ref >1.3)
Pneumo Ab Type 26 (6B)*: 0.2 ug/mL — ABNORMAL LOW (ref >1.3)
Pneumo Ab Type 3*: 0.2 ug/mL — ABNORMAL LOW (ref >1.3)
Pneumo Ab Type 34 (10A)*: 0.4 ug/mL — ABNORMAL LOW (ref >1.3)
Pneumo Ab Type 4*: <0.1 ug/mL — ABNORMAL LOW (ref >1.3)
Pneumo Ab Type 43 (11A)*: 1.2 ug/mL — ABNORMAL LOW (ref >1.3)
Pneumo Ab Type 5*: 0.2 ug/mL — ABNORMAL LOW (ref >1.3)
Pneumo Ab Type 51 (7F)*: 3 ug/mL (ref >1.3)
Pneumo Ab Type 54 (15B)*: 1.6 ug/mL (ref >1.3)
Pneumo Ab Type 56 (18C)*: <0.1 ug/mL — ABNORMAL LOW (ref >1.3)
Pneumo Ab Type 57 (19A)*: 1 ug/mL — ABNORMAL LOW (ref >1.3)
Pneumo Ab Type 68 (9V)*: 0.1 ug/mL — ABNORMAL LOW (ref >1.3)
Pneumo Ab Type 70 (33F)*: 6.3 ug/mL (ref >1.3)
Pneumo Ab Type 8*: <0.3 ug/mL — ABNORMAL LOW (ref >1.3)
Pneumo Ab Type 9 (9N)*: 0.1 ug/mL — ABNORMAL LOW (ref >1.3)

## 2022-05-08 LAB — LYMPH ENUMERATION, BASIC & NK CELLS
% CD 3 Pos. Lymph.: 51.5 % — ABNORMAL LOW (ref 57.5–86.2)
% CD 4 Pos. Lymph.: 38.6 % (ref 30.8–58.5)
% NK (CD56/16): 23.7 % — ABNORMAL HIGH (ref 1.4–19.4)
Ab NK (CD56/16): 261 /uL (ref 24–406)
Absolute CD 3: 567 /uL — ABNORMAL LOW (ref 622–2402)
Absolute CD 4 Helper: 425 /uL (ref 359–1519)
Basophils Absolute: 0 10*3/uL (ref 0.0–0.2)
Basos: 1 %
CD19 % B Cell: 24.1 % (ref 3.3–25.4)
CD19 Abs: 265 /uL (ref 12–645)
CD4/CD8 Ratio: 3.24 (ref 0.92–3.72)
CD8 % Suppressor T Cell: 11.9 % — ABNORMAL LOW (ref 12.0–35.5)
CD8 T Cell Abs: 131 /uL (ref 109–897)
EOS (ABSOLUTE): 0.3 10*3/uL (ref 0.0–0.4)
Eos: 5 %
Hematocrit: 40.8 % (ref 37.5–51.0)
Hemoglobin: 14 g/dL (ref 13.0–17.7)
Immature Grans (Abs): 0 10*3/uL (ref 0.0–0.1)
Immature Granulocytes: 0 %
Lymphocytes Absolute: 1.1 10*3/uL (ref 0.7–3.1)
Lymphs: 22 %
MCH: 30.6 pg (ref 26.6–33.0)
MCHC: 34.3 g/dL (ref 31.5–35.7)
MCV: 89 fL (ref 79–97)
Monocytes Absolute: 0.3 10*3/uL (ref 0.1–0.9)
Monocytes: 7 %
Neutrophils Absolute: 3.2 10*3/uL (ref 1.4–7.0)
Neutrophils: 65 %
Platelets: 399 10*3/uL (ref 150–450)
RBC: 4.58 x10E6/uL (ref 4.14–5.80)
RDW: 13.6 % (ref 11.6–15.4)
WBC: 4.9 10*3/uL (ref 3.4–10.8)

## 2022-05-08 LAB — IGG, IGA, IGM
IgA/Immunoglobulin A, Serum: 327 mg/dL (ref 90–386)
IgG (Immunoglobin G), Serum: 985 mg/dL (ref 603–1613)
IgM (Immunoglobulin M), Srm: 81 mg/dL (ref 20–172)

## 2022-05-08 LAB — DIPHTHERIA / TETANUS ANTIBODY PANEL
Diphtheria Ab: 1.19 IU/mL (ref <0.10)
Tetanus Ab, IgG: 1.77 IU/mL (ref <0.10)

## 2022-05-08 NOTE — Progress Notes (Signed)
Called patient to review test results. No answer, left generic VM to call back.  Immune labs show a low T-cell count. He does not have the type of infections to make me concerned for a genetic T-cell deficiency. I would however recommend HIV screening as this could lead to low T cell counts.     He also does not have adequate strep pneumonia titers. I would therefore recommend he get the Pneumovax 23 vaccine and have those titers repeated 6 weeks following vaccination.   If he calls back, please relay this message.

## 2022-05-08 NOTE — Progress Notes (Signed)
Called patient to review test results. No answer, left generic VM to call back.  Immune labs show a low T-cell count. He does not have the type of infections to make me concerned for a genetic T-cell deficiency. I would however recommend HIV screening as this could lead to low T cell counts.     He also does not have adequate strep pneumonia titers. I would therefore recommend he get the Pneumovax 23 vaccine and have those titers repeated 6 weeks following vaccination.   If he calls back, please relay this message.  Additionally his allergy profile shows he is allergic to ragweed only. This is a fall allergen. Please send pollen avoidance measures.  Thanks

## 2022-05-08 NOTE — Progress Notes (Signed)
Ignore previous message-it was meant for [place] clinical.

## 2022-05-09 ENCOUNTER — Telehealth: Payer: Self-pay

## 2022-05-09 NOTE — Telephone Encounter (Signed)
Patient called in  - DOB verified - reviewed lab result w/patient for him to write provider notation/recommendations down to discuss w/his provider.  Patient verbalized understanding to all, no further questions.

## 2022-05-29 ENCOUNTER — Telehealth: Payer: Self-pay | Admitting: Internal Medicine

## 2022-05-31 MED ORDER — DUPIXENT 300 MG/2ML ~~LOC~~ SOSY: 600.0000 mg | PREFILLED_SYRINGE | Freq: Once | SUBCUTANEOUS | 11 refills | Status: AC

## 2022-05-31 NOTE — Telephone Encounter (Signed)
Called patient and advised rx to [facility] and he will have to followup with rx since they will not speak to me. When Rx received advised patient to bring same into clinic for admin instrux and loading dose

## 2022-08-20 ENCOUNTER — Telehealth: Payer: Self-pay | Admitting: Pulmonary Disease

## 2022-08-20 NOTE — Telephone Encounter (Signed)
PT has 2 Pulm Dr.'s. [place] and the [place]. His Pharm will not fill his most recent RX saying he is over medicated. He  is on Stiolto, Oral Pred & Flucticasone.   Another of his Dr's office was calling to get medications "on the same page". I tsf tyo Wendee Beavers in Triage.

## 2022-08-20 NOTE — Telephone Encounter (Signed)
Spoke with Lynnell Catalan, Nurse Case Manager, advised that this patient has never been seen by Dr. Everardo All or in our clinic.  Advised he is seen at [facility].  Nothing further needed.

## 2022-08-20 NOTE — Telephone Encounter (Signed)
Pharmacy wont fill medication due to pt being on Pt being on too many Steroid pls advise   Asmanex inhaler  Fluticasone inhaler  Stiolto Respimat  Prednisone Tablet   [facility] [place] [facility] prescribed to them ?

## 2022-08-20 NOTE — Telephone Encounter (Signed)
Not our PT

## 2022-08-27 ENCOUNTER — Telehealth: Payer: Self-pay | Admitting: Internal Medicine

## 2022-08-27 NOTE — Telephone Encounter (Signed)
[facility] will [facility]  at [facility]" at  [address] [place], [place] [address]  Pharmacy number : [phone]

## 2022-09-02 MED ORDER — DUPIXENT 300 MG/2ML ~~LOC~~ SOSY: 300.0000 mg | PREFILLED_SYRINGE | SUBCUTANEOUS | 11 refills | Status: DC

## 2022-09-02 NOTE — Addendum Note (Signed)
Addended by: Devoria Glassing on: 09/02/2022 01:19 PM   Modules accepted: Orders

## 2022-09-02 NOTE — Telephone Encounter (Signed)
Rx sent 

## 2022-09-12 ENCOUNTER — Other Ambulatory Visit (HOSPITAL_COMMUNITY): Payer: Self-pay

## 2022-10-30 ENCOUNTER — Ambulatory Visit: Payer: No Typology Code available for payment source | Admitting: Internal Medicine

## 2022-11-14 ENCOUNTER — Emergency Department (HOSPITAL_COMMUNITY): Payer: Medicaid Other

## 2022-11-14 ENCOUNTER — Encounter (HOSPITAL_COMMUNITY): Payer: Self-pay

## 2022-11-14 ENCOUNTER — Inpatient Hospital Stay (HOSPITAL_COMMUNITY)
Admission: EM | Admit: 2022-11-14 | Discharge: 2022-11-18 | DRG: 191 | Disposition: A | Discharge status: Home / Self Care | Payer: Medicaid Other | Attending: Internal Medicine | Admitting: Internal Medicine

## 2022-11-14 ENCOUNTER — Other Ambulatory Visit: Payer: Self-pay

## 2022-11-14 DIAGNOSIS — Z79899 Other long term (current) drug therapy: Secondary | ICD-10-CM

## 2022-11-14 DIAGNOSIS — R0902 Hypoxemia: Principal | ICD-10-CM

## 2022-11-14 DIAGNOSIS — J9611 Chronic respiratory failure with hypoxia: Secondary | ICD-10-CM | POA: Diagnosis present

## 2022-11-14 DIAGNOSIS — Z1152 Encounter for screening for COVID-19: Secondary | ICD-10-CM

## 2022-11-14 DIAGNOSIS — Z87891 Personal history of nicotine dependence: Secondary | ICD-10-CM

## 2022-11-14 DIAGNOSIS — J441 Chronic obstructive pulmonary disease with (acute) exacerbation: Principal | ICD-10-CM | POA: Diagnosis present

## 2022-11-14 DIAGNOSIS — Z8616 Personal history of COVID-19: Secondary | ICD-10-CM

## 2022-11-14 DIAGNOSIS — Z7951 Long term (current) use of inhaled steroids: Secondary | ICD-10-CM

## 2022-11-14 LAB — CBC WITH DIFFERENTIAL/PLATELET
Abs Immature Granulocytes: 0.02 10*3/uL (ref 0.00–0.07)
Basophils Absolute: 0 10*3/uL (ref 0.0–0.1)
Basophils Relative: 0 %
Eosinophils Absolute: 1.6 10*3/uL — ABNORMAL HIGH (ref 0.0–0.5)
Eosinophils Relative: 17 %
HCT: 44.7 % (ref 39.0–52.0)
Hemoglobin: 15 g/dL (ref 13.0–17.0)
Immature Granulocytes: 0 %
Lymphocytes Relative: 5 %
Lymphs Abs: 0.5 10*3/uL — ABNORMAL LOW (ref 0.7–4.0)
MCH: 31.1 pg (ref 26.0–34.0)
MCHC: 33.6 g/dL (ref 30.0–36.0)
MCV: 92.5 fL (ref 80.0–100.0)
Monocytes Absolute: 0.4 10*3/uL (ref 0.1–1.0)
Monocytes Relative: 4 %
Neutro Abs: 6.8 10*3/uL (ref 1.7–7.7)
Neutrophils Relative %: 74 %
Platelets: 433 10*3/uL — ABNORMAL HIGH (ref 150–400)
RBC: 4.83 MIL/uL (ref 4.22–5.81)
RDW: 13 % (ref 11.5–15.5)
WBC: 9.3 10*3/uL (ref 4.0–10.5)
nRBC: 0 % (ref 0.0–0.2)

## 2022-11-14 LAB — BASIC METABOLIC PANEL
Anion gap: 11 (ref 5–15)
BUN: 15 mg/dL (ref 6–20)
CO2: 25 mmol/L (ref 22–32)
Calcium: 9.1 mg/dL (ref 8.9–10.3)
Chloride: 100 mmol/L (ref 98–111)
Creatinine, Ser: 0.9 mg/dL (ref 0.61–1.24)
GFR, Estimated: >60 mL/min (ref >60)
Glucose, Bld: 172 mg/dL — ABNORMAL HIGH (ref 70–99)
Potassium: 3.7 mmol/L (ref 3.5–5.1)
Sodium: 136 mmol/L (ref 135–145)

## 2022-11-14 LAB — RESP PANEL BY RT-PCR (RSV, FLU A&B, COVID)  RVPGX2
Influenza A by PCR: NEGATIVE
Influenza B by PCR: NEGATIVE
Resp Syncytial Virus by PCR: NEGATIVE
SARS Coronavirus 2 by RT PCR: NEGATIVE

## 2022-11-14 MED ORDER — METHYLPREDNISOLONE SODIUM SUCC 125 MG IJ SOLR
125.0000 mg | Freq: Once | INTRAMUSCULAR | Status: AC
  Administered 2022-11-14: 125 mg via INTRAVENOUS
  Filled 2022-11-14: qty 2

## 2022-11-14 MED ORDER — IPRATROPIUM-ALBUTEROL 0.5-2.5 (3) MG/3ML IN SOLN
3.0000 mL | Freq: Once | RESPIRATORY_TRACT | Status: AC
  Administered 2022-11-14: 3 mL via RESPIRATORY_TRACT
  Filled 2022-11-14: qty 3

## 2022-11-14 MED ORDER — ACETAMINOPHEN 650 MG RE SUPP: 650.0000 mg | Freq: Four times a day (QID) | RECTAL | Status: DC | PRN

## 2022-11-14 MED ORDER — IPRATROPIUM-ALBUTEROL 0.5-2.5 (3) MG/3ML IN SOLN
3.0000 mL | Freq: Once | RESPIRATORY_TRACT | Status: AC
  Administered 2022-11-14: 3 mL via RESPIRATORY_TRACT

## 2022-11-14 MED ORDER — OXYCODONE HCL 5 MG PO TABS
5.0000 mg | ORAL_TABLET | ORAL | Status: DC | PRN
  Administered 2022-11-15 – 2022-11-18 (×7): 5 mg via ORAL
  Filled 2022-11-14 (×8): qty 1

## 2022-11-14 MED ORDER — PREDNISONE 20 MG PO TABS
40.0000 mg | ORAL_TABLET | Freq: Every day | ORAL | Status: DC
  Administered 2022-11-16 – 2022-11-18 (×3): 40 mg via ORAL
  Filled 2022-11-14 (×3): qty 2

## 2022-11-14 MED ORDER — ACETAMINOPHEN 325 MG PO TABS: 650.0000 mg | ORAL_TABLET | Freq: Four times a day (QID) | ORAL | Status: DC | PRN

## 2022-11-14 MED ORDER — METHYLPREDNISOLONE SODIUM SUCC 125 MG IJ SOLR
125.0000 mg | Freq: Two times a day (BID) | INTRAMUSCULAR | Status: AC
  Administered 2022-11-15 (×2): 125 mg via INTRAVENOUS
  Filled 2022-11-14 (×2): qty 2

## 2022-11-14 MED ORDER — SODIUM CHLORIDE 0.9 % IV SOLN
1.0000 g | INTRAVENOUS | Status: DC
  Administered 2022-11-14 – 2022-11-17 (×4): 1 g via INTRAVENOUS
  Filled 2022-11-14 (×4): qty 10

## 2022-11-14 MED ORDER — ONDANSETRON HCL 4 MG PO TABS: 4.0000 mg | ORAL_TABLET | Freq: Four times a day (QID) | ORAL | Status: DC | PRN

## 2022-11-14 MED ORDER — GABAPENTIN 300 MG PO CAPS
300.0000 mg | ORAL_CAPSULE | Freq: Every day | ORAL | Status: DC
  Administered 2022-11-14 – 2022-11-17 (×4): 300 mg via ORAL
  Filled 2022-11-14 (×4): qty 1

## 2022-11-14 MED ORDER — ONDANSETRON HCL 4 MG/2ML IJ SOLN: 4.0000 mg | Freq: Four times a day (QID) | INTRAMUSCULAR | Status: DC | PRN

## 2022-11-14 MED ORDER — IPRATROPIUM-ALBUTEROL 0.5-2.5 (3) MG/3ML IN SOLN
3.0000 mL | Freq: Four times a day (QID) | RESPIRATORY_TRACT | Status: DC
  Administered 2022-11-15 – 2022-11-17 (×9): 3 mL via RESPIRATORY_TRACT
  Filled 2022-11-14 (×9): qty 3

## 2022-11-14 MED ORDER — ENOXAPARIN SODIUM 40 MG/0.4ML IJ SOSY
40.0000 mg | PREFILLED_SYRINGE | INTRAMUSCULAR | Status: DC
  Administered 2022-11-15 – 2022-11-18 (×4): 40 mg via SUBCUTANEOUS
  Filled 2022-11-14 (×4): qty 0.4

## 2022-11-14 MED ORDER — ALBUTEROL SULFATE (2.5 MG/3ML) 0.083% IN NEBU
2.5000 mg | INHALATION_SOLUTION | RESPIRATORY_TRACT | Status: DC | PRN
  Administered 2022-11-16 (×2): 2.5 mg via RESPIRATORY_TRACT
  Filled 2022-11-14 (×3): qty 3

## 2022-11-14 MED ORDER — POLYETHYLENE GLYCOL 3350 17 G PO PACK
17.0000 g | PACK | Freq: Every day | ORAL | Status: DC | PRN
  Administered 2022-11-17 – 2022-11-18 (×2): 17 g via ORAL
  Filled 2022-11-14 (×2): qty 1

## 2022-11-14 NOTE — H&P (Signed)
History and Physical    Jesse Powell YQM:[id number] DOB: Aug 12, 1965 DOA: 11/14/2022  [facility]P: [facility]   Patient coming from: Home   Chief Complaint: SOB, increased cough and sputum production   HPI: Jesse Powell is a 57 y.o. male with medical history significant for COPD and chronic hypoxic respiratory failure who presents with worsening shortness of breath and cough.  Shortness of breath has been worse than usual for approximately 2 weeks, but particularly severe for the past 2 days.  He has also experienced worsening in his chronic cough with increased sputum production.  Complicating matters, patient states that he has not had electricity in his residence for the past 3 weeks.  ED Course: Upon arrival to the ED, patient is found to be afebrile and saturating mid 90s on 2 L/min of supplemental oxygen with mild tachypnea, tachycardia, and stable blood pressure.  Labs are most notable for negative respiratory virus panel.  Patient was treated with 125 mg IV Solu-Medrol and DuoNeb x 3 in the ED.  Review of Systems:  All other systems reviewed and apart from HPI, are negative.  Past Medical History:  Diagnosis Date   COPD (chronic obstructive pulmonary disease) (HCC)     Past Surgical History:  Procedure Laterality Date   LUNG SURGERY  2019    Social History:   reports that he has quit smoking. His smoking use included cigarettes. He has never been exposed to tobacco smoke. He has never used smokeless tobacco. He reports current alcohol use of about 3.0 standard drinks of alcohol per week. He reports that he does not use drugs.  No Known Allergies  History reviewed. No pertinent family history.   Prior to Admission medications   Medication Sig Start Date End Date Taking? Authorizing Provider  budesonide (PULMICORT) 0.5 MG/2ML nebulizer solution Take 0.5 mg by nebulization 2 (two) times daily. 08/20/22  Yes [provider]  omeprazole (PRILOSEC) 40 MG  capsule Take 40 mg by mouth daily. 11/14/22  Yes [provider]  albuterol (VENTOLIN HFA) 108 (90 Base) MCG/ACT inhaler Inhale 2 puffs into the lungs every 6 (six) hours as needed for wheezing or shortness of breath (cough). 04/08/22   Burnadette Pop, MD  azithromycin (ZITHROMAX) 250 MG tablet Take 250 mg by mouth once a week.    [provider]  cyclobenzaprine (FLEXERIL) 10 MG tablet Take 1 tablet by mouth at bedtime as needed for muscle spasms. 08/08/20   [provider]  doxycycline (VIBRA-TABS) 100 MG tablet Take 1 tablet by mouth 2 (two) times daily. 04/02/22   [provider]  dupilumab (DUPIXENT) 300 MG/2ML prefilled syringe Inject 300 mg into the skin every 14 (fourteen) days. 09/02/22   Verlee Monte, MD  fluticasone (FLONASE) 50 MCG/ACT nasal spray Place 1 spray into both nostrils daily. 02/02/21 02/02/22  Calvert Cantor, MD  fluticasone (FLONASE) 50 MCG/ACT nasal spray Place 2 sprays into both nostrils daily. 11/22/21   [provider]  gabapentin (NEURONTIN) 300 MG capsule Take 300 mg by mouth at bedtime.    [provider]  guaifenesin (HUMIBID E) 400 MG TABS tablet TAKE ONE TABLET BY MOUTH EVERY 6 HOURS AS NEEDED FOR COUGH 08/02/21   [provider]  ibuprofen (ADVIL) 800 MG tablet Take 1 tablet (800 mg total) by mouth every 12 (twelve) hours as needed. 04/08/22   Burnadette Pop, MD  ipratropium-albuterol (DUONEB) 0.5-2.5 (3) MG/3ML SOLN Take 3 mLs by nebulization every 6 (six) hours as needed. 04/08/22  Burnadette Pop, MD  mometasone [facility]) 220 MCG/INH inhaler Inhale 2 puffs into the lungs at bedtime. 09/06/19   [provider]  predniSONE (DELTASONE) 10 MG tablet Take 1 tablet (10 mg total) by mouth daily. Take 4 pills daily for 3 days then 3 pills daily for 3 days then 2 pills daily for 3 days then 1 pill daily for 3 days then stop 04/08/22   Burnadette Pop, MD  Tiotropium Bromide-Olodaterol 2.5-2.5 MCG/ACT AERS  Inhale 2 puffs into the lungs in the morning. 04/08/22   Burnadette Pop, MD    Physical Exam: Vitals:   11/14/22 1756 11/14/22 1757 11/14/22 2100 11/14/22 2115  BP:  117/75    Pulse: (!) 103  91 86  Resp: (!) 24  20 (!) 21  Temp: 97.9 F (36.6 C)     TempSrc: Oral     SpO2: 95%  94% 94%  Weight:      Height:         Constitutional: NAD, no pallor or diaphoresis   Eyes: PERTLA, lids and conjunctivae normal ENMT: Mucous membranes are moist. Posterior pharynx clear of any exudate or lesions.   Neck: supple, no masses  Respiratory: Diminished bilaterally with prolonged expiratory phase. Dyspneic with speech.  Cardiovascular: S1 & S2 heard, regular rate and rhythm. No extremity edema.   Abdomen: No distension, no tenderness, soft. Bowel sounds active.  Musculoskeletal: no clubbing / cyanosis. No joint deformity upper and lower extremities.   Skin: no significant rashes, lesions, ulcers. Warm, dry, well-perfused. Neurologic: CN 2-12 grossly intact. Moving all extremities. Alert and oriented.  Psychiatric: Calm. Cooperative.    Labs and Imaging on Admission: I have personally reviewed following labs and imaging studies  CBC: Recent Labs  Lab 11/14/22 1828  WBC 9.3  NEUTROABS 6.8  HGB 15.0  HCT 44.7  MCV 92.5  PLT 433*   Basic Metabolic Panel: Recent Labs  Lab 11/14/22 1828  NA 136  K 3.7  CL 100  CO2 25  GLUCOSE 172*  BUN 15  CREATININE 0.90  CALCIUM 9.1   GFR: Estimated Creatinine Clearance: 89.2 mL/min (by C-G formula based on SCr of 0.9 mg/dL). Liver Function Tests: No results for input(s): "AST", "ALT", "ALKPHOS", "BILITOT", "PROT", "ALBUMIN" in the last 168 hours. No results for input(s): "LIPASE", "AMYLASE" in the last 168 hours. No results for input(s): "AMMONIA" in the last 168 hours. Coagulation Profile: No results for input(s): "INR", "PROTIME" in the last 168 hours. Cardiac Enzymes: No results for input(s): "CKTOTAL", "CKMB", "CKMBINDEX",  "TROPONINI" in the last 168 hours. BNP (last 3 results) No results for input(s): "PROBNP" in the last 8760 hours. HbA1C: No results for input(s): "HGBA1C" in the last 72 hours. CBG: No results for input(s): "GLUCAP" in the last 168 hours. Lipid Profile: No results for input(s): "CHOL", "HDL", "LDLCALC", "TRIG", "CHOLHDL", "LDLDIRECT" in the last 72 hours. Thyroid Function Tests: No results for input(s): "TSH", "T4TOTAL", "FREET4", "T3FREE", "THYROIDAB" in the last 72 hours. Anemia Panel: No results for input(s): "VITAMINB12", "FOLATE", "FERRITIN", "TIBC", "IRON", "RETICCTPCT" in the last 72 hours. Urine analysis: No results found for: "COLORURINE", "APPEARANCEUR", "LABSPEC", "PHURINE", "GLUCOSEU", "HGBUR", "BILIRUBINUR", "KETONESUR", "PROTEINUR", "UROBILINOGEN", "NITRITE", "LEUKOCYTESUR" Sepsis Labs: @LABRCNTIP (procalcitonin:4,lacticidven:4) ) Recent Results (from the past 240 hour(s))  Resp panel by RT-PCR (RSV, Flu A&B, Covid) Anterior Nasal Swab     Status: None   Collection Time: 11/14/22  6:50 PM   Specimen: Anterior Nasal Swab  Result Value Ref Range Status   SARS Coronavirus 2 by  RT PCR NEGATIVE NEGATIVE Final    Comment: (NOTE) SARS-CoV-2 target nucleic acids are NOT DETECTED.  The SARS-CoV-2 RNA is generally detectable in upper respiratory specimens during the acute phase of infection. The lowest concentration of SARS-CoV-2 viral copies this assay can detect is 138 copies/mL. A negative result does not preclude SARS-Cov-2 infection and should not be used as the sole basis for treatment or other patient management decisions. A negative result may occur with  improper specimen collection/handling, submission of specimen other than nasopharyngeal swab, presence of viral mutation(s) within the areas targeted by this assay, and inadequate number of viral copies(<138 copies/mL). A negative result must be combined with clinical observations, patient history, and  epidemiological information. The expected result is Negative.  Fact Sheet for Patients:  BloggerCourse.com  Fact Sheet for Healthcare Providers:  SeriousBroker.it  This test is no t yet approved or cleared by the [place] FDA and  has been authorized for detection and/or diagnosis of SARS-CoV-2 by FDA under an Emergency Use Authorization (EUA). This EUA will remain  in effect (meaning this test can be used) for the duration of the COVID-19 declaration under Section 564(b)(1) of the Act, 21 U.S.C.section 360bbb-3(b)(1), unless the authorization is terminated  or revoked sooner.       Influenza A by PCR NEGATIVE NEGATIVE Final   Influenza B by PCR NEGATIVE NEGATIVE Final    Comment: (NOTE) The Xpert Xpress SARS-CoV-2/FLU/RSV plus assay is intended as an aid in the diagnosis of influenza from Nasopharyngeal swab specimens and should not be used as a sole basis for treatment. Nasal washings and aspirates are unacceptable for Xpert Xpress SARS-CoV-2/FLU/RSV testing.  Fact Sheet for Patients: BloggerCourse.com  Fact Sheet for Healthcare Providers: SeriousBroker.it  This test is not yet approved or cleared by the [place] FDA and has been authorized for detection and/or diagnosis of SARS-CoV-2 by FDA under an Emergency Use Authorization (EUA). This EUA will remain in effect (meaning this test can be used) for the duration of the COVID-19 declaration under Section 564(b)(1) of the Act, 21 U.S.C. section 360bbb-3(b)(1), unless the authorization is terminated or revoked.     Resp Syncytial Virus by PCR NEGATIVE NEGATIVE Final    Comment: (NOTE) Fact Sheet for Patients: BloggerCourse.com  Fact Sheet for Healthcare Providers: SeriousBroker.it  This test is not yet approved or cleared by the [place] FDA and has been  authorized for detection and/or diagnosis of SARS-CoV-2 by FDA under an Emergency Use Authorization (EUA). This EUA will remain in effect (meaning this test can be used) for the duration of the COVID-19 declaration under Section 564(b)(1) of the Act, 21 U.S.C. section 360bbb-3(b)(1), unless the authorization is terminated or revoked.  Performed at [facility], [address], [place], [place] [address]      Radiological Exams on Admission: DG Chest 2 View  Result Date: 11/14/2022 CLINICAL DATA:  Shortness of breath EXAM: CHEST - 2 VIEW COMPARISON:  04/06/2022 FINDINGS: There is hyperinflation of the lungs compatible with COPD. Airspace opacity again noted in the right lower lung, decreased since prior study, likely residual scarring. Left lung clear. Heart and mediastinal contours are within normal limits. No effusions or acute bony abnormality. IMPRESSION: COPD/chronic changes.  Scarring at the right lung base. No active disease. Electronically Signed   By: Charlett Nose M.D.   On: 11/14/2022 20:11    EKG: Independently reviewed. Sinus tachycardia, rate 103.   Assessment/Plan  1. COPD exacerbation; chronic hypoxic respiratory failure  -  Culture sputum, start antibiotic, continue systemic steroid, schedule DuoNeb, use additional SABA prn, and continue supplemental O2     DVT prophylaxis: Lovenox  Code Status: Full  Level of Care: Level of care: Med-Surg Family Communication: Significant other at bedside   Disposition Plan:  Patient is from: home  Anticipated d/c is to: home  Anticipated d/c date is: 10/4 or 11/16/22  Patient currently: pending improved respiratory status  Consults called: None  Admission status: Observation     Briscoe Deutscher, MD [facility]  11/14/2022, 10:18 PM

## 2022-11-14 NOTE — ED Notes (Signed)
Pt ambulated out in the hallway with 93% o2 on 2 liters.
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
## 2022-11-15 DIAGNOSIS — R0902 Hypoxemia: Secondary | ICD-10-CM | POA: Diagnosis present

## 2022-11-15 DIAGNOSIS — Z8616 Personal history of COVID-19: Secondary | ICD-10-CM | POA: Diagnosis not present

## 2022-11-15 DIAGNOSIS — J441 Chronic obstructive pulmonary disease with (acute) exacerbation: Secondary | ICD-10-CM | POA: Diagnosis present

## 2022-11-15 DIAGNOSIS — J9611 Chronic respiratory failure with hypoxia: Secondary | ICD-10-CM | POA: Diagnosis present

## 2022-11-15 DIAGNOSIS — Z79899 Other long term (current) drug therapy: Secondary | ICD-10-CM | POA: Diagnosis not present

## 2022-11-15 DIAGNOSIS — Z87891 Personal history of nicotine dependence: Secondary | ICD-10-CM | POA: Diagnosis not present

## 2022-11-15 DIAGNOSIS — Z7951 Long term (current) use of inhaled steroids: Secondary | ICD-10-CM | POA: Diagnosis not present

## 2022-11-15 DIAGNOSIS — Z1152 Encounter for screening for COVID-19: Secondary | ICD-10-CM | POA: Diagnosis not present

## 2022-11-15 LAB — CBC
HCT: 42.3 % (ref 39.0–52.0)
Hemoglobin: 14.1 g/dL (ref 13.0–17.0)
MCH: 30.9 pg (ref 26.0–34.0)
MCHC: 33.3 g/dL (ref 30.0–36.0)
MCV: 92.8 fL (ref 80.0–100.0)
Platelets: 408 10*3/uL — ABNORMAL HIGH (ref 150–400)
RBC: 4.56 MIL/uL (ref 4.22–5.81)
RDW: 12.9 % (ref 11.5–15.5)
WBC: 6.8 10*3/uL (ref 4.0–10.5)
nRBC: 0 % (ref 0.0–0.2)

## 2022-11-15 LAB — BASIC METABOLIC PANEL
Anion gap: 7 (ref 5–15)
BUN: 14 mg/dL (ref 6–20)
CO2: 27 mmol/L (ref 22–32)
Calcium: 9 mg/dL (ref 8.9–10.3)
Chloride: 101 mmol/L (ref 98–111)
Creatinine, Ser: 0.85 mg/dL (ref 0.61–1.24)
GFR, Estimated: >60 mL/min (ref >60)
Glucose, Bld: 143 mg/dL — ABNORMAL HIGH (ref 70–99)
Potassium: 4.1 mmol/L (ref 3.5–5.1)
Sodium: 135 mmol/L (ref 135–145)

## 2022-11-15 LAB — HIV ANTIBODY (ROUTINE TESTING W REFLEX): HIV Screen 4th Generation wRfx: NONREACTIVE

## 2022-11-15 LAB — HEMOGLOBIN A1C
Hgb A1c MFr Bld: 6.2 % — ABNORMAL HIGH (ref 4.8–5.6)
Mean Plasma Glucose: 131.24 mg/dL

## 2022-11-15 MED ORDER — ORAL CARE MOUTH RINSE: 15.0000 mL | OROMUCOSAL | Status: DC | PRN

## 2022-11-15 MED ORDER — DUPILUMAB 300 MG/2ML ~~LOC~~ SOSY
300.0000 mg | PREFILLED_SYRINGE | SUBCUTANEOUS | Status: DC
  Administered 2022-11-15: 300 mg via SUBCUTANEOUS
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
## 2022-11-15 NOTE — Plan of Care (Signed)
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
## 2022-11-16 DIAGNOSIS — J441 Chronic obstructive pulmonary disease with (acute) exacerbation: Secondary | ICD-10-CM | POA: Diagnosis not present

## 2022-11-16 DIAGNOSIS — R0902 Hypoxemia: Secondary | ICD-10-CM | POA: Diagnosis not present

## 2022-11-16 LAB — BASIC METABOLIC PANEL
Anion gap: 8 (ref 5–15)
BUN: 13 mg/dL (ref 6–20)
CO2: 28 mmol/L (ref 22–32)
Calcium: 8.7 mg/dL — ABNORMAL LOW (ref 8.9–10.3)
Chloride: 99 mmol/L (ref 98–111)
Creatinine, Ser: 0.68 mg/dL (ref 0.61–1.24)
GFR, Estimated: >60 mL/min (ref >60)
Glucose, Bld: 112 mg/dL — ABNORMAL HIGH (ref 70–99)
Potassium: 4.7 mmol/L (ref 3.5–5.1)
Sodium: 135 mmol/L (ref 135–145)

## 2022-11-16 LAB — CBC
HCT: 39.6 % (ref 39.0–52.0)
Hemoglobin: 12.9 g/dL — ABNORMAL LOW (ref 13.0–17.0)
MCH: 31 pg (ref 26.0–34.0)
MCHC: 32.6 g/dL (ref 30.0–36.0)
MCV: 95.2 fL (ref 80.0–100.0)
Platelets: 421 10*3/uL — ABNORMAL HIGH (ref 150–400)
RBC: 4.16 MIL/uL — ABNORMAL LOW (ref 4.22–5.81)
RDW: 13.3 % (ref 11.5–15.5)
WBC: 19.8 10*3/uL — ABNORMAL HIGH (ref 4.0–10.5)
nRBC: 0 % (ref 0.0–0.2)
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
## 2022-11-16 NOTE — Plan of Care (Signed)
  Problem: Education: Goal: Knowledge of the prescribed therapeutic regimen will improve Outcome: Progressing   Problem: Activity: Goal: Ability to tolerate increased activity will improve Outcome: Progressing Goal: Will verbalize the importance of balancing activity with adequate rest periods Outcome: Progressing   Problem: Respiratory: Goal: Ability to maintain a clear airway will improve Outcome: Progressing Goal: Levels of oxygenation will improve Outcome: Progressing Goal: Ability to maintain adequate ventilation will improve Outcome: Progressing   Problem: Health Behavior/Discharge Planning: Goal: Ability to manage health-related needs will improve Outcome: Progressing   Problem: Clinical Measurements: Goal: Will remain free from infection Outcome: Progressing Goal: Respiratory complications will improve Outcome: Progressing   Problem: Coping: Goal: Level of anxiety will decrease Outcome: Progressing   Problem: Pain Managment: Goal: General experience of comfort will improve Outcome: Progressing

## 2022-11-17 DIAGNOSIS — R0902 Hypoxemia: Secondary | ICD-10-CM | POA: Diagnosis not present

## 2022-11-17 DIAGNOSIS — J441 Chronic obstructive pulmonary disease with (acute) exacerbation: Secondary | ICD-10-CM | POA: Diagnosis not present

## 2022-11-17 LAB — BASIC METABOLIC PANEL
Anion gap: 11 (ref 5–15)
BUN: 17 mg/dL (ref 6–20)
CO2: 27 mmol/L (ref 22–32)
Calcium: 9.2 mg/dL (ref 8.9–10.3)
Chloride: 98 mmol/L (ref 98–111)
Creatinine, Ser: 0.73 mg/dL (ref 0.61–1.24)
GFR, Estimated: >60 mL/min (ref >60)
Glucose, Bld: 113 mg/dL — ABNORMAL HIGH (ref 70–99)
Potassium: 4.3 mmol/L (ref 3.5–5.1)
Sodium: 136 mmol/L (ref 135–145)

## 2022-11-17 LAB — CBC
HCT: 41.6 % (ref 39.0–52.0)
Hemoglobin: 13.2 g/dL (ref 13.0–17.0)
MCH: 31.1 pg (ref 26.0–34.0)
MCHC: 31.7 g/dL (ref 30.0–36.0)
MCV: 97.9 fL (ref 80.0–100.0)
Platelets: 474 10*3/uL — ABNORMAL HIGH (ref 150–400)
RBC: 4.25 MIL/uL (ref 4.22–5.81)
RDW: 13.8 % (ref 11.5–15.5)
WBC: 13.5 10*3/uL — ABNORMAL HIGH (ref 4.0–10.5)
nRBC: 0.1 % (ref 0.0–0.2)

## 2022-11-17 MED ORDER — GUAIFENESIN ER 600 MG PO TB12
1200.0000 mg | ORAL_TABLET | Freq: Two times a day (BID) | ORAL | Status: DC
  Administered 2022-11-17 – 2022-11-18 (×3): 1200 mg via ORAL
  Filled 2022-11-17 (×3): qty 2

## 2022-11-17 MED ORDER — IPRATROPIUM-ALBUTEROL 0.5-2.5 (3) MG/3ML IN SOLN
3.0000 mL | RESPIRATORY_TRACT | Status: DC
  Administered 2022-11-17 – 2022-11-18 (×9): 3 mL via RESPIRATORY_TRACT
  Filled 2022-11-17 (×9): qty 3

## 2022-11-17 NOTE — Plan of Care (Signed)
  Problem: Activity: Goal: Ability to tolerate increased activity will improve Outcome: Progressing   Problem: Respiratory: Goal: Ability to maintain a clear airway will improve Outcome: Progressing Goal: Levels of oxygenation will improve Outcome: Progressing   
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
## 2022-11-17 NOTE — Plan of Care (Signed)
  Problem: Education: Goal: Knowledge of the prescribed therapeutic regimen will improve Outcome: Progressing   Problem: Activity: Goal: Will verbalize the importance of balancing activity with adequate rest periods Outcome: Progressing   Problem: Respiratory: Goal: Ability to maintain a clear airway will improve Outcome: Progressing Goal: Levels of oxygenation will improve Outcome: Progressing Goal: Ability to maintain adequate ventilation will improve Outcome: Progressing   Problem: Clinical Measurements: Goal: Will remain free from infection Outcome: Progressing   Problem: Coping: Goal: Level of anxiety will decrease Outcome: Progressing   Problem: Pain Managment: Goal: General experience of comfort will improve Outcome: Progressing

## 2022-11-17 NOTE — Progress Notes (Signed)
Mobility Specialist - Progress Note   11/17/22 1129  Mobility  Activity Ambulated with assistance in hallway  Level of Assistance Modified independent, requires aide device or extra time  Assistive Device None  Distance Ambulated (ft) 80 ft  Range of Motion/Exercises Active  Activity Response Tolerated well  Mobility Referral Yes  $Mobility charge 1 Mobility  Mobility Specialist Start Time (ACUTE ONLY) 1120  Mobility Specialist Stop Time (ACUTE ONLY) 1128  Mobility Specialist Time Calculation (min) (ACUTE ONLY) 8 min   Pt received in bed and agreed to mobility. Had no issues throughout session,  O2 saturation did not get lower than 93%. Returned to bed with all needs met.  Marilynne Halsted Mobility Specialist

## 2022-11-18 DIAGNOSIS — R0902 Hypoxemia: Secondary | ICD-10-CM | POA: Diagnosis not present

## 2022-11-18 DIAGNOSIS — J441 Chronic obstructive pulmonary disease with (acute) exacerbation: Secondary | ICD-10-CM | POA: Diagnosis not present

## 2022-11-18 MED ORDER — IPRATROPIUM-ALBUTEROL 0.5-2.5 (3) MG/3ML IN SOLN: 3.0000 mL | Freq: Four times a day (QID) | RESPIRATORY_TRACT | 0 refills | Status: AC | PRN

## 2022-11-18 MED ORDER — PANTOPRAZOLE SODIUM 40 MG PO TBEC: 40.0000 mg | DELAYED_RELEASE_TABLET | Freq: Every day | ORAL | 0 refills | Status: AC

## 2022-11-18 MED ORDER — CALCIUM CARBONATE ANTACID 500 MG PO CHEW
1.0000 | CHEWABLE_TABLET | Freq: Three times a day (TID) | ORAL | Status: DC | PRN
  Administered 2022-11-18: 200 mg via ORAL
  Filled 2022-11-18: qty 1

## 2022-11-18 MED ORDER — BUDESONIDE 0.5 MG/2ML IN SUSP: 0.5000 mg | Freq: Two times a day (BID) | RESPIRATORY_TRACT | 0 refills | Status: AC

## 2022-11-18 MED ORDER — FLUTICASONE PROPIONATE 50 MCG/ACT NA SUSP: 2.0000 | Freq: Every day | NASAL | 0 refills | Status: AC

## 2022-11-18 MED ORDER — CALCIUM CARBONATE ANTACID 500 MG PO CHEW: 1.0000 | CHEWABLE_TABLET | Freq: Three times a day (TID) | ORAL | 0 refills | Status: AC | PRN

## 2022-11-18 MED ORDER — PANTOPRAZOLE SODIUM 40 MG PO TBEC
40.0000 mg | DELAYED_RELEASE_TABLET | Freq: Every day | ORAL | Status: DC
  Administered 2022-11-18: 40 mg via ORAL
  Filled 2022-11-18: qty 1

## 2022-11-18 MED ORDER — PREDNISONE 10 MG PO TABS: ORAL_TABLET | ORAL | 0 refills | Status: AC

## 2022-11-18 NOTE — Discharge Summary (Addendum)
Physician Discharge Summary   Patient: Jesse Powell MRN: [id number] DOB: [birth year]  Admit date:     11/14/2022  Discharge date: 11/18/22  Discharge Physician: Rickey Barbara   PCP: [facility]   Recommendations at discharge:    Follow up with PCP in 1-2 weeks  Discharge Diagnoses: Principal Problem:   COPD exacerbation (HCC) Active Problems:   Chronic respiratory failure with hypoxia (HCC)  Resolved Problems:   * No resolved hospital problems. *  Hospital Course: 57 y.o. male with medical history significant for COPD and chronic hypoxic respiratory failure who presents with worsening shortness of breath and cough.   Shortness of breath has been worse than usual for approximately 2 weeks, but particularly severe for the past 2 days.  He has also experienced worsening in his chronic cough with increased sputum production.  Complicating matters, patient states that he has not had electricity in his residence for the past 3 weeks.  Assessment and Plan: 1. COPD exacerbation; chronic hypoxic respiratory failure  -COVID and Flu neg -Likely secondary to social issues surrounding loss of power at home. See TOC documentation -Pt was continued on steroids, scheduled nebs -O2 was weaned to baseline 2LNC -Pt to f/u with power company to restore power    Consultants:  Procedures performed:   Disposition: Home Diet recommendation:  Regular diet DISCHARGE MEDICATION: Allergies as of 11/18/2022   No Known Allergies      Medication List     STOP taking these medications    doxycycline 100 MG tablet Commonly known as: VIBRA-TABS   omeprazole 40 MG capsule Commonly known as: PRILOSEC   Tiotropium Bromide-Olodaterol 2.5-2.5 MCG/ACT Aers       TAKE these medications    albuterol 108 (90 Base) MCG/ACT inhaler Commonly known as: VENTOLIN HFA Inhale 2 puffs into the lungs every 6 (six) hours as needed for wheezing or shortness of breath (cough).   azithromycin  250 MG tablet Commonly known as: ZITHROMAX Take 250 mg by mouth once a week.   budesonide 0.5 MG/2ML nebulizer solution Commonly known as: PULMICORT Take 2 mLs (0.5 mg total) by nebulization 2 (two) times daily.   calcium carbonate 500 MG chewable tablet Commonly known as: TUMS - dosed in mg elemental calcium Chew 1 tablet (200 mg of elemental calcium total) by mouth 3 (three) times daily as needed for indigestion or heartburn.   cyclobenzaprine 10 MG tablet Commonly known as: FLEXERIL Take 1 tablet by mouth at bedtime as needed for muscle spasms.   Dupixent 300 MG/2ML prefilled syringe Generic drug: dupilumab Inject 300 mg into the skin every 14 (fourteen) days.   fluticasone 50 MCG/ACT nasal spray Commonly known as: FLONASE Place 2 sprays into both nostrils daily.   gabapentin 300 MG capsule Commonly known as: NEURONTIN Take 300 mg by mouth at bedtime.   guaifenesin 400 MG Tabs tablet Commonly known as: HUMIBID E Take 400 mg by mouth in the morning and at bedtime.   ibuprofen 800 MG tablet Commonly known as: ADVIL Take 1 tablet (800 mg total) by mouth every 12 (twelve) hours as needed.   ipratropium-albuterol 0.5-2.5 (3) MG/3ML Soln Commonly known as: DUONEB Take 3 mLs by nebulization every 6 (six) hours as needed.   mometasone 220 MCG/INH inhaler Commonly known as: ASMANEX Inhale 2 puffs into the lungs at bedtime.   pantoprazole 40 MG tablet Commonly known as: PROTONIX Take 1 tablet (40 mg total) by mouth daily. Start taking on: November 19, 2022   predniSONE 10  MG tablet Commonly known as: DELTASONE Take 4 tablets (40 mg total) by mouth daily with breakfast for 3 days, THEN 2 tablets (20 mg total) daily with breakfast for 3 days, THEN 1 tablet (10 mg total) daily with breakfast for 3 days, THEN 0.5 tablets (5 mg total) daily with breakfast for 2 days. Start taking on: November 18, 2022 What changed: See the new instructions.        Follow-up Information      [facility] in 2 week(s).   Why: Hospital follow up Contact information: [address] [facility] [place] [address] [phone]                Discharge Exam: Ceasar Mons Weights   11/14/22 1755  Weight: 68 kg   General exam: Awake, laying in bed, in nad Respiratory system: Normal respiratory effort, end expiratory wheezing Cardiovascular system: regular rate, s1, s2 Gastrointestinal system: Soft, nondistended, positive BS Central nervous system: CN2-12 grossly intact, strength intact Extremities: Perfused, no clubbing Skin: Normal skin turgor, no notable skin lesions seen Psychiatry: Mood normal // no visual hallucinations   Condition at discharge: fair  The results of significant diagnostics from this hospitalization (including imaging, microbiology, ancillary and laboratory) are listed below for reference.   Imaging Studies: DG Chest 2 View  Result Date: 11/14/2022 CLINICAL DATA:  Shortness of breath EXAM: CHEST - 2 VIEW COMPARISON:  04/06/2022 FINDINGS: There is hyperinflation of the lungs compatible with COPD. Airspace opacity again noted in the right lower lung, decreased since prior study, likely residual scarring. Left lung clear. Heart and mediastinal contours are within normal limits. No effusions or acute bony abnormality. IMPRESSION: COPD/chronic changes.  Scarring at the right lung base. No active disease. Electronically Signed   By: Charlett Nose M.D.   On: 11/14/2022 20:11    Microbiology: Results for orders placed or performed during the hospital encounter of 11/14/22  Resp panel by RT-PCR (RSV, Flu A&B, Covid) Anterior Nasal Swab     Status: None   Collection Time: 11/14/22  6:50 PM   Specimen: Anterior Nasal Swab  Result Value Ref Range Status   SARS Coronavirus 2 by RT PCR NEGATIVE NEGATIVE Final    Comment: (NOTE) SARS-CoV-2 target nucleic acids are NOT DETECTED.  The SARS-CoV-2 RNA is generally detectable in upper  respiratory specimens during the acute phase of infection. The lowest concentration of SARS-CoV-2 viral copies this assay can detect is 138 copies/mL. A negative result does not preclude SARS-Cov-2 infection and should not be used as the sole basis for treatment or other patient management decisions. A negative result may occur with  improper specimen collection/handling, submission of specimen other than nasopharyngeal swab, presence of viral mutation(s) within the areas targeted by this assay, and inadequate number of viral copies(<138 copies/mL). A negative result must be combined with clinical observations, patient history, and epidemiological information. The expected result is Negative.  Fact Sheet for Patients:  BloggerCourse.com  Fact Sheet for Healthcare Providers:  SeriousBroker.it  This test is no t yet approved or cleared by the [place] FDA and  has been authorized for detection and/or diagnosis of SARS-CoV-2 by FDA under an Emergency Use Authorization (EUA). This EUA will remain  in effect (meaning this test can be used) for the duration of the COVID-19 declaration under Section 564(b)(1) of the Act, 21 U.S.C.section 360bbb-3(b)(1), unless the authorization is terminated  or revoked sooner.       Influenza A by PCR NEGATIVE NEGATIVE Final  Influenza B by PCR NEGATIVE NEGATIVE Final    Comment: (NOTE) The Xpert Xpress SARS-CoV-2/FLU/RSV plus assay is intended as an aid in the diagnosis of influenza from Nasopharyngeal swab specimens and should not be used as a sole basis for treatment. Nasal washings and aspirates are unacceptable for Xpert Xpress SARS-CoV-2/FLU/RSV testing.  Fact Sheet for Patients: BloggerCourse.com  Fact Sheet for Healthcare Providers: SeriousBroker.it  This test is not yet approved or cleared by the [place] FDA and has been  authorized for detection and/or diagnosis of SARS-CoV-2 by FDA under an Emergency Use Authorization (EUA). This EUA will remain in effect (meaning this test can be used) for the duration of the COVID-19 declaration under Section 564(b)(1) of the Act, 21 U.S.C. section 360bbb-3(b)(1), unless the authorization is terminated or revoked.     Resp Syncytial Virus by PCR NEGATIVE NEGATIVE Final    Comment: (NOTE) Fact Sheet for Patients: BloggerCourse.com  Fact Sheet for Healthcare Providers: SeriousBroker.it  This test is not yet approved or cleared by the [place] FDA and has been authorized for detection and/or diagnosis of SARS-CoV-2 by FDA under an Emergency Use Authorization (EUA). This EUA will remain in effect (meaning this test can be used) for the duration of the COVID-19 declaration under Section 564(b)(1) of the Act, 21 U.S.C. section 360bbb-3(b)(1), unless the authorization is terminated or revoked.  Performed at [facility], [address], [place], [place] [address]     Labs: CBC: Recent Labs  Lab 11/14/22 1828 11/15/22 0529 11/16/22 0550 11/17/22 0539  WBC 9.3 6.8 19.8* 13.5*  NEUTROABS 6.8  --   --   --   HGB 15.0 14.1 12.9* 13.2  HCT 44.7 42.3 39.6 41.6  MCV 92.5 92.8 95.2 97.9  PLT 433* 408* 421* 474*   Basic Metabolic Panel: Recent Labs  Lab 11/14/22 1828 11/15/22 0529 11/16/22 0550 11/17/22 0539  NA 136 135 135 136  K 3.7 4.1 4.7 4.3  CL 100 101 99 98  CO2 25 27 28 27   GLUCOSE 172* 143* 112* 113*  BUN 15 14 13 17   CREATININE 0.90 0.85 0.68 0.73  CALCIUM 9.1 9.0 8.7* 9.2   Liver Function Tests: No results for input(s): "AST", "ALT", "ALKPHOS", "BILITOT", "PROT", "ALBUMIN" in the last 168 hours. CBG: No results for input(s): "GLUCAP" in the last 168 hours.  Discharge time spent: less than 30 minutes.  Signed: Rickey Barbara, MD [facility] 11/18/2022

## 2022-11-18 NOTE — Plan of Care (Signed)
  Problem: Respiratory: Goal: Ability to maintain a clear airway will improve Outcome: Progressing Goal: Levels of oxygenation will improve Outcome: Progressing   Problem: Clinical Measurements: Goal: Will remain free from infection Outcome: Progressing   Problem: Coping: Goal: Level of anxiety will decrease Outcome: Progressing

## 2022-11-18 NOTE — TOC Progression Note (Addendum)
Transition of Care [facility]) - Progression Note    Patient Details  Name: Jesse Powell MRN: [id number] Date of Birth: 14-Jun-1965  [facility] CM/SW Contact  Otelia Santee, LCSW Phone Number: 11/18/2022, 11:33 AM  Clinical Narrative:    Resources for social services and financial resources placed on AVS.  Attempted to reach [place] w/ [employer]. Message has been left.   ADDENDUM: Spoke with Beola Cord at [facility] who shares that there are several people working with pt to get his electricity turned back on. Shanda Bumps is to continue to follow pt once returns home for further assistance.   Pt to discharge home today. Portable O2 tank has been ordered through [employer] to be delivered to pt's room prior to discharge.    Expected Discharge Plan: Home/Self Care Barriers to Discharge: Continued Medical Work up, Unsafe home situation  Expected Discharge Plan and Services In-house Referral: Clinical Social Work Discharge Planning Services: NA Post Acute Care Choice: NA Living arrangements for the past 2 months: Single Family Home                                       Social Determinants of Health (SDOH) Interventions SDOH Screenings   Food Insecurity: No Food Insecurity (11/15/2022)  Housing: Low Risk  (11/15/2022)  Transportation Needs: No Transportation Needs (11/15/2022)  Utilities: Not At Risk (11/15/2022)  Social Connections: Unknown (06/26/2021)   Received from [facility]  Tobacco Use: Medium Risk (11/14/2022)    Readmission Risk Interventions    04/08/2022   11:06 AM  Readmission Risk Prevention Plan  Post Dischage Appt Complete  Medication Screening Complete  Transportation Screening Complete

## 2022-12-30 ENCOUNTER — Encounter: Payer: Self-pay | Admitting: Internal Medicine

## 2022-12-30 ENCOUNTER — Ambulatory Visit (INDEPENDENT_AMBULATORY_CARE_PROVIDER_SITE_OTHER): Payer: Non-veteran care | Admitting: Internal Medicine

## 2022-12-30 VITALS — BP 156/78 | HR 98 | Temp 98.5°F | Resp 20 | Wt 158.0 lb

## 2022-12-30 DIAGNOSIS — J4489 Other specified chronic obstructive pulmonary disease: Secondary | ICD-10-CM

## 2022-12-30 DIAGNOSIS — J9611 Chronic respiratory failure with hypoxia: Secondary | ICD-10-CM | POA: Diagnosis not present

## 2022-12-30 NOTE — Patient Instructions (Signed)
Chronic Obstructive Pulmonary Disease (COPD)/Asthma Overlap, Oxygen Dependent: Recent hospitalization due to pneumothorax following COPD exacerbation. Currently on 2 liters of oxygen, Stiolto, Asmanex, and Ventolin with infrequent need for rescue inhaler. Noted improvement in symptoms and physical activity after starting Dupixent injections. -Continue current inhaler regimen and oxygen as prescribed by pulmonologist. -Continue self-administered Dupixent injections every 2 weeks. -Consider over-the-counter saline gel for nasal dryness secondary to oxygen use. -Encourage low-impact cardio exercise within tolerance, such as walking or stationary biking. -Annual follow-up to monitor Dupixent use and overall lung function. -Continue regular follow-up with pulmonary.   Follow up : 1 year, sooner if needed It was a pleasure meeting you in clinic today! Thank you for allowing me to participate in your care.

## 2022-12-30 NOTE — Progress Notes (Signed)
FOLLOW UP Date of Service/Encounter:  12/30/22  Subjective:  Jesse Powell (DOB: February 28, 1965) is a 57 y.o. 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 History obtained from: chart review and patient.  For Review, LV was on 05/01/22  with Dr.Shilpa Bushee seen for intial visit for eosinophilic asthma/copd overlap . See below for summary of history and diagnostics.   Therapeutic plans/changes recommended: recommended dupixent. ----------------------------------------------------- Pertinent History/Diagnostics:  COPD/asthma overlap : on daily oxygen, also followed by Pulmonary at [facility]  taking stiolto, asmanex daily and albuterol as needed. Using daily. He is former smoker, never vaped. Quit 5 years ago. 1 ppd x 20 years. Newports. He reports girlfriend vapes but not around him. He is on 2L oxygen at baseline. Increases during flares. -2024: 2 hospitalization for COPD exacerbations -04/07/22: AEC 200 -04/06/22: CXR: Compared to the most recent 07/21/2021 radiographs, there is increased opacification overlying the medial right lower lung scarring, which may represent aspiration or pneumonia. 2. Chronic posterior right lower lobe linear band of scarring. 3. Chronic paraseptal emphysematous changes and bullae predominantly within the superomedial right lung. -[facility] 5 rounds systemic [facility] 2023 -[facility] Asmanex, Stiolto, and ventolin -2020 Pulmonary CTA: No filling defect 2. Patchy airspace opacities in both lung bases, aspiration pneumonitis or pneumonia not excluded. 3. Paraseptal emphysema and severe cystic bronchiectasis. 4. There is some bandlike densities in the right upper lobe with some nodular components which likely merit surveillance, including a 1.1 by 0.9 cm right upper lobe nodule on image 85/14. Consider surveillance chest CT in 3  months time to exclude the unlikely possibility of neoplastic process. 5. Aortic Atherosclerosis (ICD10-I70.0) and Emphysema (ICD10-J43.9). 2018 PET scan: Lung findings: Left upper lobe nodule measuring 1.4 x 0.8 cm demonstrates mild FDG uptake with maximal SUV of 1.38 (image 86). Likely linear scarring and/or subsegmental atelectasis involving both lower lobes with no malignant range FDG uptake. Substantial paraseptal emphysema.  - mixed severe obstruction/restriction with significant response in FVC following bronchodilator spirometry (05/01/22): ratio 0.58, 29.5% FEV1 (pre), 31% FEV1 (post), FVC 1.56L pre and 1.84 L post (+ 17%, 280 mL change ) - Initial plan: continue therapy plan per Pulmonary and add Dupixent injections Recurrent infections:  hospitalized around 5 times in his lifetime for lung infections-pneumonias and COPD exacerbations.  He has had pneumonia 4 times. Denis sinus infections or any other infections not related to his lungs. Other: steroid-dependent COPD, cocaine abuse, Lung CA s/p lung nodule removed? 2020, recurrent bronchiectasis, hx of malignant neoplasm of lungs  --------------------------------------------------- Today presents for follow-up. Discussed the use of AI scribe software for clinical note transcription with the patient, who gave verbal consent to proceed.  History of Present Illness             Chart Review: Hospitalization 11/13/22 for COPD exacerbation: "COVID and Flu neg -Likely secondary to social issues surrounding loss of power at home. See TOC documentation -Pt was continued on steroids, scheduled nebs -O2 was weaned to baseline 2LNC -Pt to f/u with power company to restore power"  All medications reviewed by clinical staff and updated in chart. No new pertinent medical or surgical history except as noted in HPI.  ROS: All others negative except as noted per HPI.   Objective:  There were no vitals taken for this visit. There is no  height or weight on file to calculate BMI. Physical Exam: General Appearance:  Alert, cooperative, no distress, appears stated age  Head:  Normocephalic, without obvious abnormality, atraumatic  Eyes:  Conjunctiva clear, EOM's intact  Ears EACs normal bilaterally and normal TMs bilaterally  Nose: Nares normal, oxygen cannula in both nares  Throat: Lips, tongue normal; teeth and gums normal, normal posterior oropharynx and + cobblestoning  Neck: Supple, symmetrical  Lungs:   clear to auscultation bilaterally, Respirations unlabored, no coughing  Heart:  regular rate and rhythm and no murmur, Appears well perfused  Extremities: No edema  Skin: Skin color, texture, turgor normal and no rashes or lesions on visualized portions of skin  Neurologic: No gross deficits   Labs:  Lab Orders  No laboratory test(s) ordered today    Spirometry:  Tracings reviewed. His effort: Good reproducible efforts. FVC: 2.19L FEV1: 0.95L, 32% predicted FEV1/FVC ratio: 0.43 Interpretation: Spirometry consistent with mixed obstructive and restrictive disease. Severe-slightly improved from prior Please see scanned spirometry results for details.  Assessment/Plan   Assessment and Plan    Chronic Obstructive Pulmonary Disease (COPD)/Asthma Overlap, Oxygen Dependent: Recent hospitalization due to pneumothorax following COPD exacerbation. Currently on 2 liters of oxygen, Stiolto, Asmanex, and Ventolin with infrequent need for rescue inhaler. Noted improvement in symptoms and physical activity after starting Dupixent injections. -Continue current inhaler regimen and oxygen as prescribed by pulmonologist. -Continue self-administered Dupixent injections every 2 weeks. -Consider over-the-counter saline gel for nasal dryness secondary to oxygen use. -Encourage low-impact cardio exercise within tolerance, such as walking or stationary biking. -Annual follow-up to monitor Dupixent use and overall lung  function. -Continue regular follow-up with pulmonary.   Follow up : 1 year, sooner if needed It was a pleasure meeting you in clinic today! Thank you for allowing me to participate in your care.  Tonny Bollman, MD [facility]      Other: none  Tonny Bollman, MD  [facility]

## 2023-02-18 IMAGING — CR DG CHEST 2V
2 series · 2 of 2 positions shown · non-contrast
Comparison: 02/01/2021

CLINICAL DATA: Shortness of breath, COPD exacerbation, hypoxia

EXAM:
CHEST - 2 VIEW

[w chest pa]
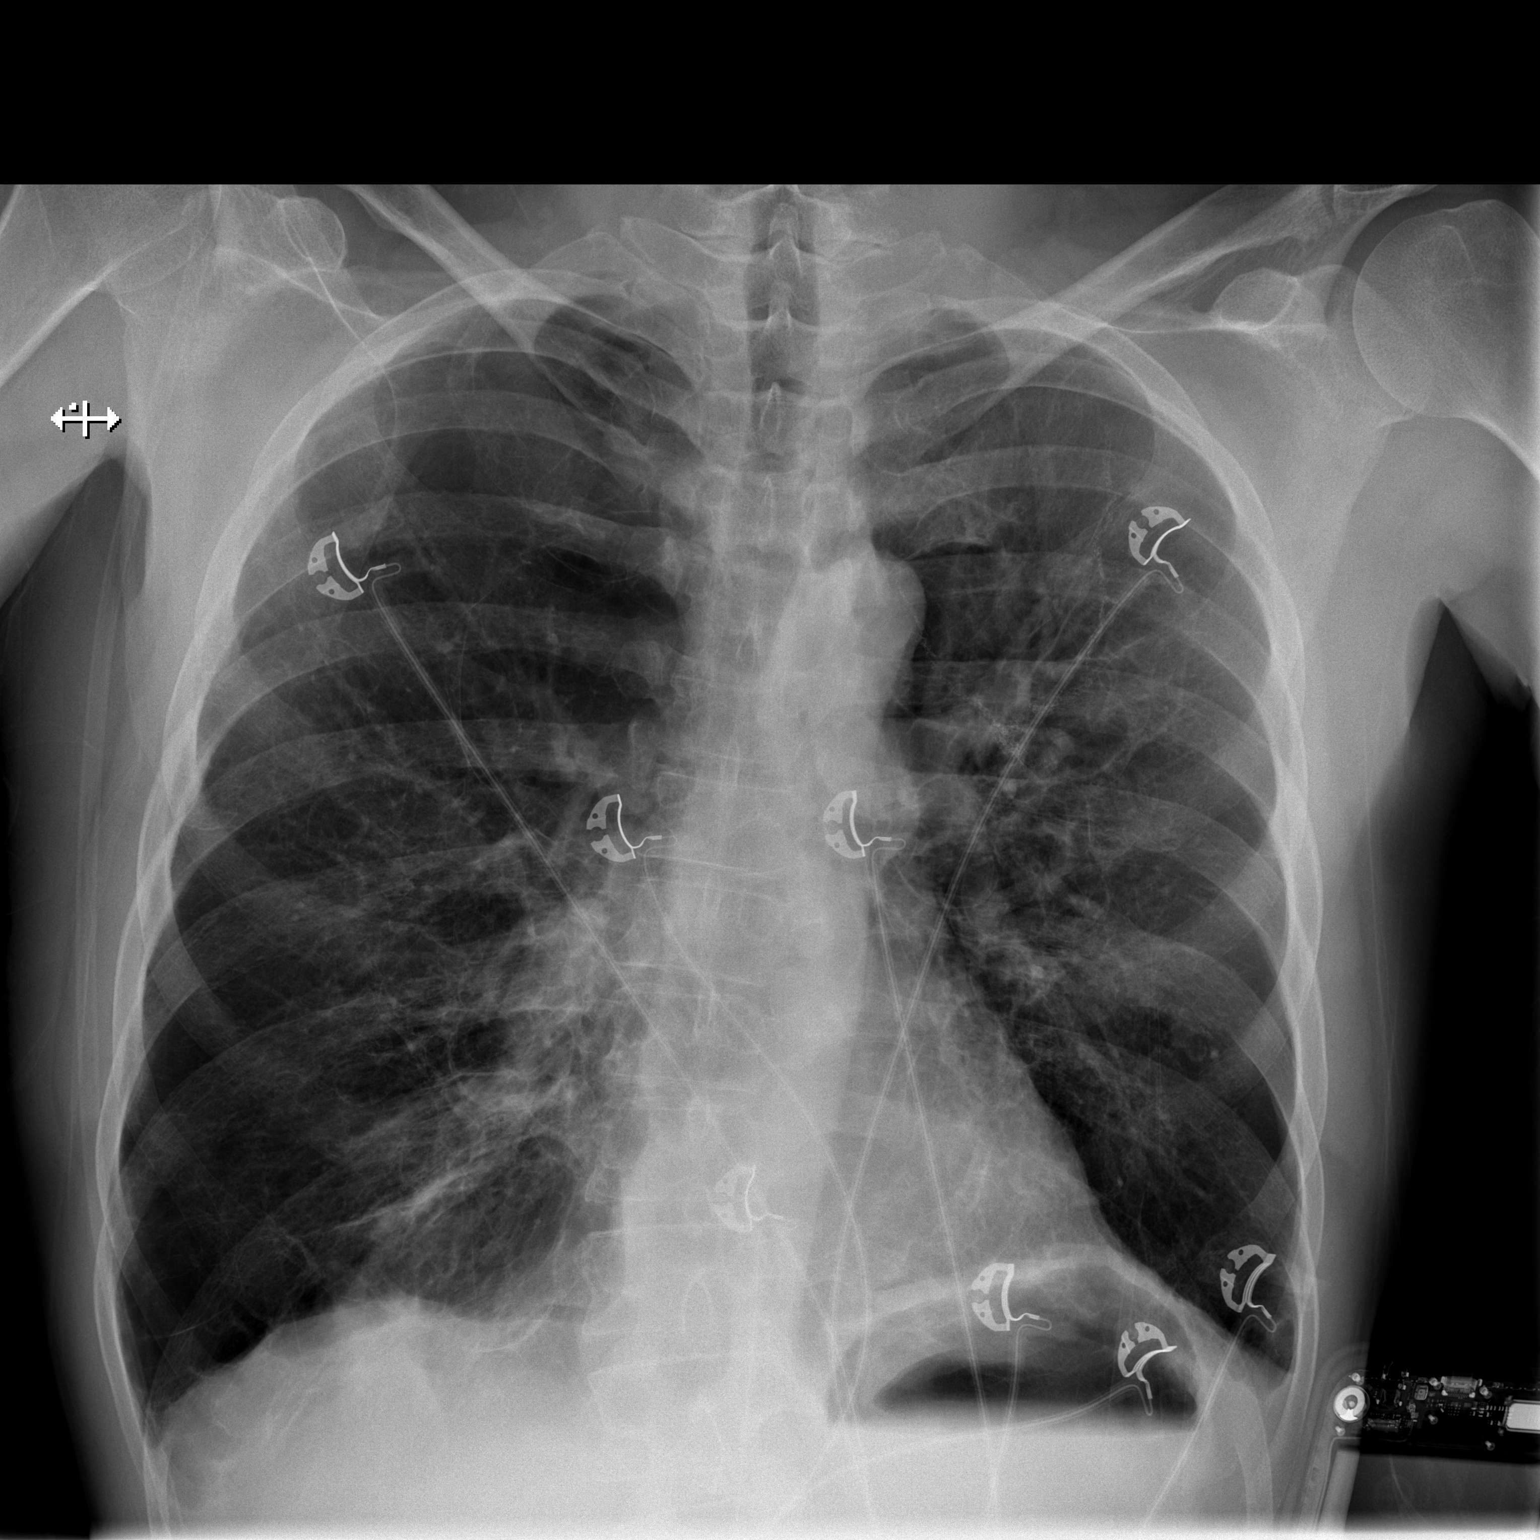

[w chest lat]
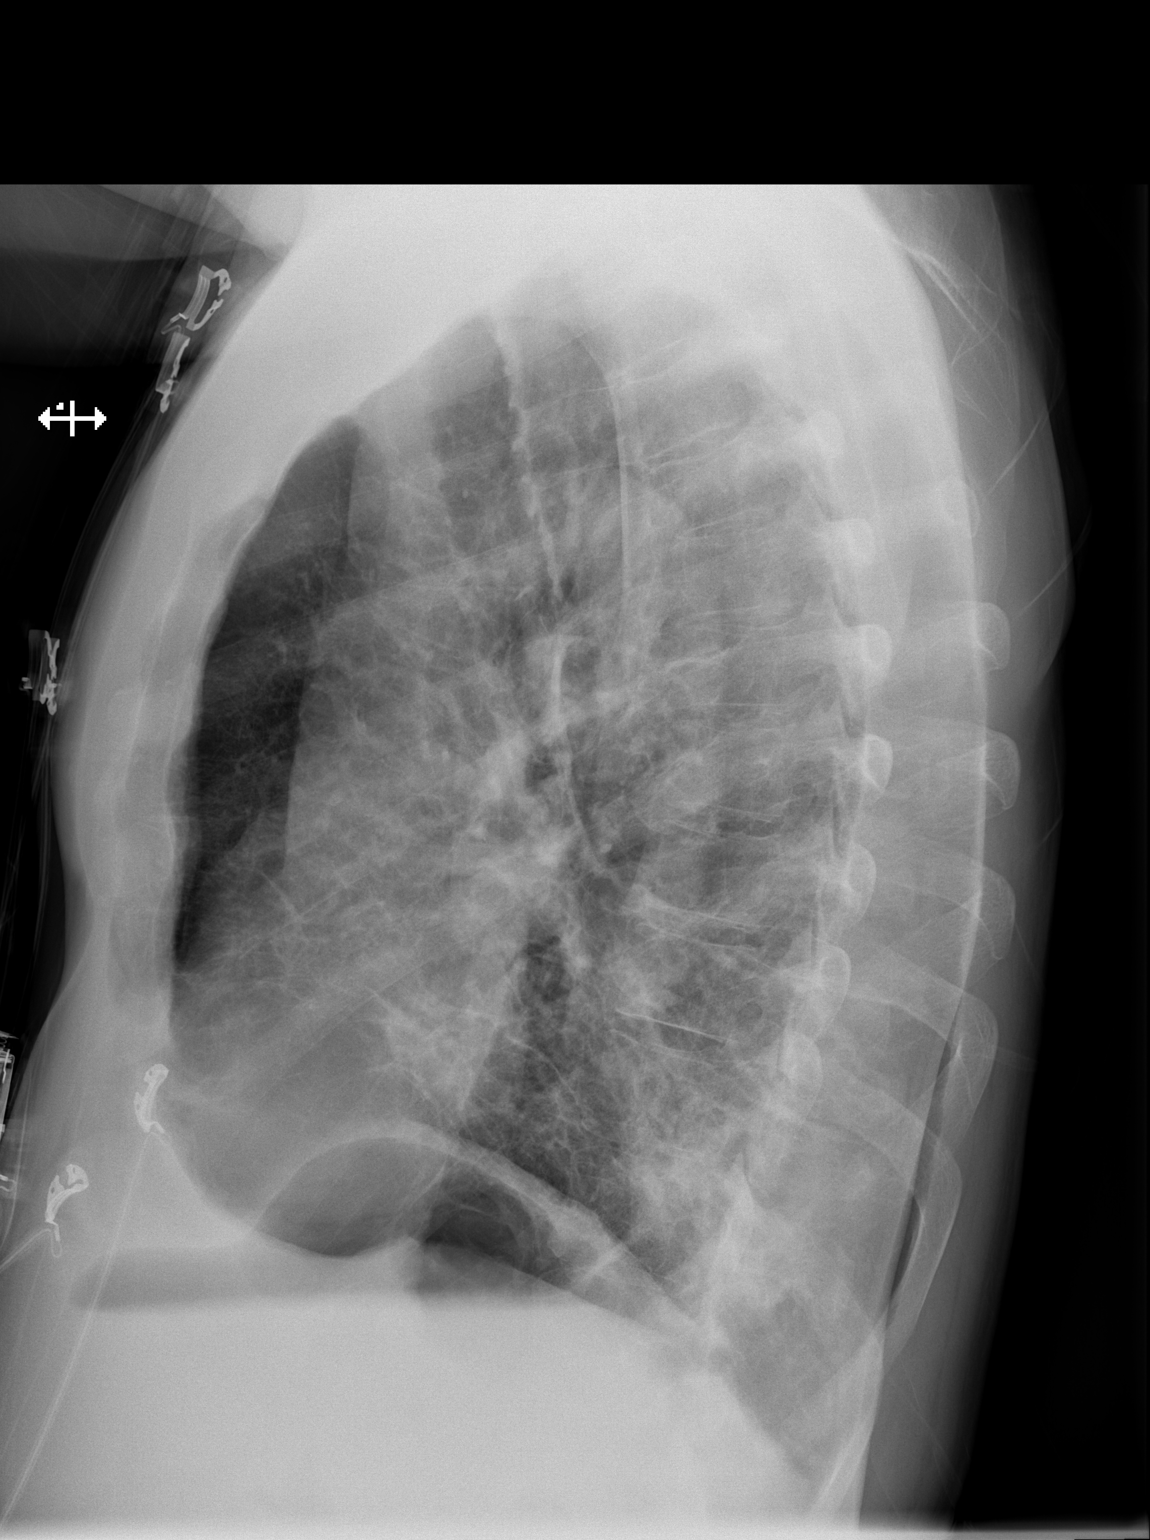

[2 of 2 positions shown; findings below may reference images not displayed]

FINDINGS: normal heart size and vascularity. Similar hyperinflation with
scattered bilateral pleuroparenchymal scarring most pronounced in
the left upper lobe and the right lower lung. No definite
superimposed pneumonia, edema, collapse or consolidation. No large
effusion or pneumothorax. Trachea midline. No acute osseous finding.
IMPRESSION: Stable chronic severe emphysema pattern. No superimposed acute
process by plain radiography.

## 2023-04-18 ENCOUNTER — Telehealth: Payer: Self-pay | Admitting: Internal Medicine

## 2023-04-18 NOTE — Telephone Encounter (Signed)
 Called and spoke with pt advise him of authorization expires on 05/01/2023 and informed authorization was sent in and advised [facility].

## 2023-05-26 ENCOUNTER — Telehealth: Payer: Self-pay

## 2023-05-26 NOTE — Telephone Encounter (Signed)
 Per Dr. Jolayne Natter patient will get a 300 mg sample until things get worked out with getting his delivered to his home. Patient came and picked up Dupixent 300 mg sample and will administer it at home as he has been doing. Patient would like Tammy to get in contact with him regarding getting his Dupixent sent to a certain pharmacy and they deliver it to his home.

## 2023-05-26 NOTE — Telephone Encounter (Signed)
 Called patient and advised last I had hands with dealing with patient Dupixent which was initially sent to [facility] they had reached out to me to have script sent to [facility] to fill. Since [facility] Ins is [facility] I would have to assume they are paying for same and he should reach to them to find out if they can authorize approval for [facility] to fill same

## 2023-05-28 ENCOUNTER — Telehealth: Payer: Self-pay

## 2023-05-28 NOTE — Telephone Encounter (Signed)
 Refer to 05/26/23 telephone encounter for update.

## 2023-05-28 NOTE — Telephone Encounter (Signed)
-----   Message from Orelia Binet sent at 05/24/2023  9:35 PM EDT ----- Regarding: dupixent example Pt called for delay in dupixent delivery, he is 5 days behind 2 week dosing. After listening it was unclear exact cause of delay. Recommend pt call [place] office on Monday to schedule follow up and give a dupixent sample to bridge until patient can be seen.

## 2023-06-23 ENCOUNTER — Other Ambulatory Visit: Payer: Self-pay | Admitting: *Deleted

## 2023-06-23 MED ORDER — DUPIXENT 300 MG/2ML ~~LOC~~ SOSY: 300.0000 mg | PREFILLED_SYRINGE | SUBCUTANEOUS | 11 refills | Status: AC

## 2023-06-23 NOTE — Telephone Encounter (Signed)
 Spoke to patient and discussed him getting rx through [place] and they have to authorize same. I think there may be confusion due to him requesting to get his rx through [facility]. Sent refill to [place] again
